# Patient Record
Sex: Female | Born: 1985 | Race: White | Hispanic: No | Marital: Married | Smoking: Current every day smoker
Health system: Southern US, Community
[De-identification: ages and names within clinical notes are randomized; demographics above are authoritative.]

## PROBLEM LIST (undated history)

## (undated) DIAGNOSIS — F329 Major depressive disorder, single episode, unspecified: Secondary | ICD-10-CM

## (undated) DIAGNOSIS — K219 Gastro-esophageal reflux disease without esophagitis: Secondary | ICD-10-CM

## (undated) DIAGNOSIS — J302 Other seasonal allergic rhinitis: Secondary | ICD-10-CM

## (undated) DIAGNOSIS — G43909 Migraine, unspecified, not intractable, without status migrainosus: Secondary | ICD-10-CM

## (undated) DIAGNOSIS — Z8489 Family history of other specified conditions: Secondary | ICD-10-CM

## (undated) DIAGNOSIS — F419 Anxiety disorder, unspecified: Secondary | ICD-10-CM

## (undated) DIAGNOSIS — Z87442 Personal history of urinary calculi: Secondary | ICD-10-CM

## (undated) DIAGNOSIS — G5601 Carpal tunnel syndrome, right upper limb: Secondary | ICD-10-CM

## (undated) DIAGNOSIS — F32A Depression, unspecified: Secondary | ICD-10-CM

## (undated) HISTORY — DX: Migraine, unspecified, not intractable, without status migrainosus: G43.909

## (undated) HISTORY — DX: Other seasonal allergic rhinitis: J30.2

## (undated) HISTORY — PX: MOUTH SURGERY: SHX715

## (undated) HISTORY — DX: Depression, unspecified: F32.A

## (undated) HISTORY — DX: Major depressive disorder, single episode, unspecified: F32.9

---

## 2012-06-01 ENCOUNTER — Encounter (HOSPITAL_COMMUNITY): Payer: Self-pay | Admitting: Emergency Medicine

## 2012-06-01 ENCOUNTER — Emergency Department (HOSPITAL_COMMUNITY)
Admission: EM | Admit: 2012-06-01 | Discharge: 2012-06-02 | Disposition: A | Discharge status: Home / Self Care | Payer: BC Managed Care – PPO | Attending: Emergency Medicine | Admitting: Emergency Medicine

## 2012-06-01 DIAGNOSIS — IMO0002 Reserved for concepts with insufficient information to code with codable children: Secondary | ICD-10-CM

## 2012-06-01 DIAGNOSIS — W278XXA Contact with other nonpowered hand tool, initial encounter: Secondary | ICD-10-CM | POA: Insufficient documentation

## 2012-06-01 DIAGNOSIS — Z23 Encounter for immunization: Secondary | ICD-10-CM | POA: Insufficient documentation

## 2012-06-01 DIAGNOSIS — Y929 Unspecified place or not applicable: Secondary | ICD-10-CM | POA: Insufficient documentation

## 2012-06-01 DIAGNOSIS — S61209A Unspecified open wound of unspecified finger without damage to nail, initial encounter: Secondary | ICD-10-CM | POA: Insufficient documentation

## 2012-06-01 DIAGNOSIS — Y939 Activity, unspecified: Secondary | ICD-10-CM | POA: Insufficient documentation

## 2012-06-01 NOTE — ED Notes (Signed)
PT. PRESENTS WITH LEFT PROXIMAL MIDDLE FINGER LACERATION SUSTAINED FROM A SCISSORS THIS EVENING , BLEEDING CONTROLLED , DRESSING APPLIED AT TRIAGE.

## 2012-06-02 MED ORDER — HYDROCODONE-ACETAMINOPHEN 5-325 MG PO TABS: 1.0000 | ORAL_TABLET | ORAL | Status: DC | PRN

## 2012-06-02 MED ORDER — TETANUS-DIPHTH-ACELL PERTUSSIS 5-2.5-18.5 LF-MCG/0.5 IM SUSP
0.5000 mL | Freq: Once | INTRAMUSCULAR | Status: AC
  Administered 2012-06-02: 0.5 mL via INTRAMUSCULAR
  Filled 2012-06-02: qty 0.5

## 2012-06-02 NOTE — ED Provider Notes (Signed)
Medical screening examination/treatment/procedure(s) were performed by non-physician practitioner and as supervising physician I was immediately available for consultation/collaboration.  Matrice Herro K Divonte Senger-Rasch, MD 06/02/12 0238 

## 2012-06-02 NOTE — ED Provider Notes (Signed)
History     CSN: [id number]  Arrival date & time 06/01/12  2114   First MD Initiated Contact with Patient 06/02/12 [phone]      Chief Complaint  Patient presents with  . Laceration    (Consider location/radiation/quality/duration/timing/severity/associated sxs/prior treatment) Patient is a 27 y.o. female presenting with skin laceration. The history is provided by the patient.  Laceration Location:  Finger Finger laceration location:  L ring finger Length (cm):  2 Depth:  Cutaneous Bleeding: controlled   Time since incident:  3 hours Injury mechanism: scisors  Pain details:    Quality:  Aching and throbbing   Severity:  Mild   Timing:  Constant Foreign body present:  No foreign bodies Relieved by:  Nothing Worsened by:  Nothing tried Ineffective treatments:  None tried Tetanus status:  Out of date   History reviewed. No pertinent past medical history.  History reviewed. No pertinent past surgical history.  No family history on file.  History  Substance Use Topics  . Smoking status: Not on file  . Smokeless tobacco: Not on file  . Alcohol Use: Not on file    OB History   Grav Para Term Preterm Abortions TAB SAB Ect Mult Living                  Review of Systems  Constitutional: Negative for fever, diaphoresis and activity change.  HENT: Negative for congestion and neck pain.   Respiratory: Negative for cough.   Genitourinary: Negative for dysuria.  Musculoskeletal: Negative for myalgias.  Skin: Positive for wound. Negative for color change.  Neurological: Negative for headaches.  All other systems reviewed and are negative.    Allergies  Review of patient's allergies indicates no known allergies.  Home Medications  No current outpatient prescriptions on file.  BP 138/88  Pulse 107  Temp(Src) 98.6 F (37 C) (Oral)  Resp 15  SpO2 98%  LMP 05/17/2012  Physical Exam  Nursing note and vitals reviewed. Constitutional: She is oriented to person,  place, and time. She appears well-developed and well-nourished. No distress.  HENT:  Head: Normocephalic and atraumatic.  Eyes: Conjunctivae and EOM are normal.  Neck: Normal range of motion.  Pulmonary/Chest: Effort normal.  Musculoskeletal: Normal range of motion.  Neurological: She is alert and oriented to person, place, and time.  Skin: Skin is warm and dry. Laceration noted. No rash noted. She is not diaphoretic.  Laceration over palmer surface of ring finger, left hand. Minimally bleeding.   Psychiatric: She has a normal mood and affect. Her behavior is normal.    ED Course  Procedures (including critical care time)  Labs Reviewed - No data to display No results found.  LACERATION REPAIR Performed by: Jaci Carrel Authorized by: Jaci Carrel Consent: Verbal consent obtained. Risks and benefits: risks, benefits and alternatives were discussed Consent given by: patient Patient identity confirmed: provided demographic data Prepped and Draped in normal sterile fashion Wound explored  Laceration Location: left hand palmer surface, ring finger  Laceration Length: 2cm  No Foreign Bodies seen or palpated  Anesthesia: local infiltration  Local anesthetic: lidocaine 2% NO epinephrine  Anesthetic total: 1 ml  Irrigation method: syringe Amount of cleaning: standard  Skin closure: 5.0 prolene  Number of sutures: 2  Technique: simple interupted  Patient tolerance: Patient tolerated the procedure well with no immediate complications.  No diagnosis found.    MDM  Laceration  Tdap booster given.Pressure irrigation performed. Laceration occurred < 8 hours prior to repair  which was well tolerated. Pt has no co morbidities to effect normal wound healing. Discussed suture home care w pt and answered questions. Pt to f-u for wound check and suture removal in 7 days. Pt is hemodynamically stable w no complaints prior to dc.           Jaci Carrel, [place] 06/02/12  [phone]

## 2012-06-15 ENCOUNTER — Ambulatory Visit (INDEPENDENT_AMBULATORY_CARE_PROVIDER_SITE_OTHER): Payer: BC Managed Care – PPO | Admitting: Family Medicine

## 2012-06-15 ENCOUNTER — Encounter: Payer: Self-pay | Admitting: Family Medicine

## 2012-06-15 VITALS — BP 118/80 | HR 98 | Temp 98.3°F | Ht 63.25 in | Wt 192.0 lb

## 2012-06-15 DIAGNOSIS — Z113 Encounter for screening for infections with a predominantly sexual mode of transmission: Secondary | ICD-10-CM

## 2012-06-15 DIAGNOSIS — Z7189 Other specified counseling: Secondary | ICD-10-CM

## 2012-06-15 DIAGNOSIS — Z7689 Persons encountering health services in other specified circumstances: Secondary | ICD-10-CM

## 2012-06-15 DIAGNOSIS — K5900 Constipation, unspecified: Secondary | ICD-10-CM

## 2012-06-15 DIAGNOSIS — Z136 Encounter for screening for cardiovascular disorders: Secondary | ICD-10-CM

## 2012-06-15 LAB — LIPID PANEL
HDL: 26.9 mg/dL — ABNORMAL LOW (ref >39.00)
Triglycerides: 133 mg/dL (ref 0.0–149.0)
VLDL: 26.6 mg/dL (ref 0.0–40.0)

## 2012-06-15 LAB — HEMOGLOBIN A1C: Hgb A1c MFr Bld: 5.6 % (ref 4.6–6.5)

## 2012-06-15 NOTE — Patient Instructions (Addendum)
-  We have ordered labs or studies at this visit. It can take up to 1-2 weeks for results and processing. We will contact you with instructions IF your results are abnormal. Normal results will be released to your [facility]. If you have not heard from [place] or can not find your results in [facility] in 2 weeks please contact our office.  -PLEASE SIGN UP FOR MYCHART TODAY   We recommend the following healthy lifestyle measures: - eat a healthy diet consisting of lots of vegetables, fruits, beans, nuts, seeds, healthy meats such as white chicken and fish and whole grains.  - avoid fried foods, fast food, processed foods, sodas, red meet and other fattening foods.  - get a least 150 minutes of aerobic exercise per week.   For the constipations: -use fiber supplement daily (metameucil or Citracel) -use mirilax for a few days when constipation to get things moving  Follow up in: 1-2 months for physical

## 2012-06-15 NOTE — Progress Notes (Signed)
Chief Complaint  Patient presents with  . Establish Care    HPI:  Angela Rivera is here to establish care. Recently moved to [place]. Last PCP and physical: has not had physical or pap in about 6 years - normal in the past  Has the following chronic problems and concerns today:  There is no problem list on file for this patient.  Health Maintenance:  Constipation/Hemorrhoids: -has had this chronically -large hard stools - sometimes BRB on stools with this -not taking fiber supplement  ROS: See pertinent positives and negatives per HPI.  Past Medical History  Diagnosis Date  . Blood in stool   . Chicken pox   . Seasonal allergies   . Kidney stones   . Migraines   . UTI (urinary tract infection)   . Heart murmur   . Depression     never hospitalized  . Constipation     Family History  Problem Relation Age of Onset  . Alcoholism Mother   . Alcoholism Father   . Alcohol abuse      grandparent  . Arthritis    . Ovarian cancer Other   . Colon cancer Other   . Breast cancer      grandparent  . Lung cancer Other   . Hyperlipidemia Mother   . Heart disease      grandparent  . Stroke Other   . Hypertension Mother   . Bipolar disorder Mother   . Bipolar disorder Sister   . Cirrhosis      aunt, uncle, and grandmother on father's side  . Hepatitis      aunt and grandmother  . Colon polyps Mother     History   Social History  . Marital Status: Single    Spouse Name: N/A    Number of Children: N/A  . Years of Education: N/A   Social History Main Topics  . Smoking status: Former Games developer  . Smokeless tobacco: None     Comment: per patient smokes when drinking   . Alcohol Use: Yes     Comment: rarely drinking - used to drink a lot, cutting back  . Drug Use: None  . Sexually Active: Yes     Comment: female partner   Other Topics Concern  . 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  EXAMCeasar Mons Vitals:   06/15/12 0814  BP: 118/80  Pulse: 98  Temp: 98.3 F (36.8 C)    Body mass index is 33.72 kg/(m^2).  GENERAL: vitals reviewed and listed above, alert, oriented, appears well hydrated and in no acute distress  HEENT: atraumatic, conjunttiva clear, no obvious abnormalities on inspection of external nose and ears  NECK: no obvious masses on inspection  LUNGS: clear to auscultation bilaterally, no wheezes, rales or rhonchi, good air movement  CV: HRRR, no peripheral edema  MS: moves all extremities without noticeable abnormality  PSYCH: pleasant and cooperative, no obvious depression or anxiety  ASSESSMENT AND PLAN:  Discussed the following assessment and plan:  Encounter to establish care with new doctor - Plan: Hemoglobin A1c, Lipid Panel  Routine screening for STI (sexually transmitted infection) - Plan: HIV Antibody, RPR -We reviewed  the PMH, PSH, FH, SH, Meds and Allergies. -discussed healthy diet and exercise -advised daily fiber supplement -FASTING LABS today -patient to schedule follow up  -Patient advised to return or notify a doctor immediately if symptoms worsen or persist or new concerns arise.  Patient Instructions  -We have ordered labs or studies at this visit. It can take up to 1-2 weeks for results and processing. We will contact you with instructions IF your results are abnormal. Normal results will be released to your [facility]. If you have not heard from [place] or can not find your results in [facility] in 2 weeks please contact our office.  -PLEASE SIGN UP FOR MYCHART TODAY   We recommend the following healthy lifestyle measures: - eat a healthy diet consisting of lots of vegetables, fruits, beans, nuts, seeds, healthy meats such as white chicken and fish and whole  grains.  - avoid fried foods, fast food, processed foods, sodas, red meet and other fattening foods.  - get a least 150 minutes of aerobic exercise per week.   For the constipations: -use fiber supplement daily (metameucil or Citracel) -use mirilax for a few days when constipation to get things moving  Follow up in: 1-2 months for physical      KIM, HANNAH R.

## 2012-06-16 ENCOUNTER — Telehealth: Payer: Self-pay | Admitting: Family Medicine

## 2012-06-16 LAB — HIV ANTIBODY (ROUTINE TESTING W REFLEX): HIV: NONREACTIVE

## 2012-06-16 NOTE — Telephone Encounter (Signed)
Most of her labs look good. Cholesterol is mildly abnormal. A healthy diet and regular exercise is advised.

## 2012-06-18 NOTE — Telephone Encounter (Signed)
Called and spoke with pt and pt is aware.  

## 2012-07-18 ENCOUNTER — Ambulatory Visit (INDEPENDENT_AMBULATORY_CARE_PROVIDER_SITE_OTHER): Payer: BC Managed Care – PPO | Admitting: Family Medicine

## 2012-07-18 ENCOUNTER — Encounter: Payer: Self-pay | Admitting: Family Medicine

## 2012-07-18 VITALS — BP 112/86 | HR 117 | Temp 98.6°F | Wt 194.0 lb

## 2012-07-18 DIAGNOSIS — Z124 Encounter for screening for malignant neoplasm of cervix: Secondary | ICD-10-CM

## 2012-07-18 DIAGNOSIS — F329 Major depressive disorder, single episode, unspecified: Secondary | ICD-10-CM

## 2012-07-18 DIAGNOSIS — Z7689 Persons encountering health services in other specified circumstances: Secondary | ICD-10-CM

## 2012-07-18 DIAGNOSIS — F3289 Other specified depressive episodes: Secondary | ICD-10-CM

## 2012-07-18 DIAGNOSIS — Z0000 Encounter for general adult medical examination without abnormal findings: Secondary | ICD-10-CM

## 2012-07-18 NOTE — Progress Notes (Signed)
Chief Complaint  Patient presents with  . Annual Exam    HPI:  Here for CPE:  -Concerns today: none, other then small bump on labia - doesn't not bother her - thinks skin tag -occ depressed mood and irritability near periods, no SI or HI, mild syptoms  -Diet:diet is still poor  -Taking folic acid: no  -Exercise: no regular exercise  -Diabetes and Dyslipidemia Screening: done recently, mild HLD - lifestyle recs  -Hx of HTN: no  -Vaccines: UTD  -pap history: thinks last pap was about 8 years ago and was normal  -FDLMP: March 25, monthly, regular  -sexual activity: yes, one female partner, no new partners  -wants STI testing: done at recent appt - HIV and RPR - does not want testing for GC/Chlam  -FH breast, colon or ovarian ca: see FH  -Alcohol, Tobacco, drug use: see social history  Review of Systems - Denies vaginal dishcharge, urinary symptoms, SOB, CP, changes bowels other then improved constipation with fiber   Past Medical History  Diagnosis Date  . Blood in stool   . Chicken pox   . Seasonal allergies   . Kidney stones   . Migraines   . UTI (urinary tract infection)   . Heart murmur   . Depression     never hospitalized  . Constipation     Family History  Problem Relation Age of Onset  . Alcoholism Mother   . Alcoholism Father   . Alcohol abuse      grandparent  . Arthritis    . Ovarian cancer Other   . Colon cancer Other   . Breast cancer      grandparent  . Lung cancer Other   . Hyperlipidemia Mother   . Heart disease      grandparent  . Stroke Other   . Hypertension Mother   . Bipolar disorder Mother   . Bipolar disorder Sister   . Cirrhosis      aunt, uncle, and grandmother on father's side  . Hepatitis      aunt and grandmother  . Colon polyps Mother     History   Social History  . Marital Status: Single    Spouse Name: N/A    Number of Children: N/A  . Years of Education: N/A   Social History Main Topics  . Smoking  status: Former Games developer  . Smokeless tobacco: None     Comment: per patient smokes when drinking   . Alcohol Use: Yes     Comment: rarely drinking - used to drink a lot, cutting back  . Drug Use: None  . Sexually Active: Yes     Comment: female partner   Other Topics Concern  . 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  EXAM:  Filed Vitals:   07/18/12 1429  BP: 112/86  Pulse: 117  Temp: 98.6 F (37 C)    GENERAL: vitals reviewed and listed below, alert, oriented, appears well hydrated and in no acute distress  HEENT: head atraumatic, PERRLA, normal appearance of eyes, ears, nose and mouth. moist mucus membranes.  NECK: supple, no masses or lymphadenopathy  LUNGS: clear to auscultation bilaterally, no rales, rhonchi  or wheeze  CV: HRRR, no peripheral edema or cyanosis, normal pedal pulses  BREAST: normal appearance - no lesions or discharge, on palpation normal breast tissue without any suspicious masses  ABDOMEN: bowel sounds normal, soft, non tender to palpation, no masses, no rebound or guarding  GU: normal appearance of external genitalia - no lesions or masses, normal vaginal mucosa - no abnormal discharge, normal appearance of cervix - no lesions or abnormal discharge, no masses or tenderness on palpation of uterus and ovaries.  RECTAL: refused  SKIN: no rash or abnormal lesions  MS: normal gait, moves all extremities normally  NEURO: CN II-XII grossly intact, normal muscle strength and sensation to light touch on extremities  PSYCH: normal affect, pleasant and cooperative  ASSESSMENT AND PLAN:  Discussed the following assessment and plan:  Encounter to establish care - Plan: Cytology - PAP  Depression  Pap smear for cervical  cancer screening - Plan: Cytology - PAP  -27 yo Female here for health maintenance exam  -Discussed and advised all [place] preventive services health task force level A and B recommendations for age, sex and risks.  -mild depression: advised regular exercise, healthy diet, counseling, follow up  -Advised at least 150 minutes of exercise per week and a healthy diet low in saturated fats and sweets and consisting of fresh fruits and vegetables, lean meats such as fish and white chicken and whole grains.  -labs, studies and vaccines per orders this encounter  No orders of the defined types were placed in this encounter.    Patient Instructions  -We have ordered a pap smear at this visit. It can take up to 1-2 weeks for results and processing. We will contact you with instructions IF your results are abnormal. Normal results will be released to your [facility]. If you have not heard from [place] or can not find your results in [facility] in 2 weeks please contact our office.  We recommend the following healthy lifestyle measures: - eat a healthy diet consisting of lots of vegetables, fruits, beans, nuts, seeds, healthy meats such as white chicken and fish and whole grains.  - avoid fried foods, fast food, processed foods, sodas, red meet and other fattening foods.  - get a least 150 minutes of aerobic exercise per week.   For mild depression get regular exercise and schedule counseling  Follow up in 2 months           Patient advised to return to clinic immediately if symptoms worsen or persist or new concerns.  @LIFEPLAN @  Return in about 2 months (around 09/17/2012) for depression.  Kriste Basque R.

## 2012-07-18 NOTE — Patient Instructions (Signed)
-  We have ordered a pap smear at this visit. It can take up to 1-2 weeks for results and processing. We will contact you with instructions IF your results are abnormal. Normal results will be released to your [facility]. If you have not heard from [place] or can not find your results in [facility] in 2 weeks please contact our office.  We recommend the following healthy lifestyle measures: - eat a healthy diet consisting of lots of vegetables, fruits, beans, nuts, seeds, healthy meats such as white chicken and fish and whole grains.  - avoid fried foods, fast food, processed foods, sodas, red meet and other fattening foods.  - get a least 150 minutes of aerobic exercise per week.   For mild depression get regular exercise and schedule counseling  Follow up in 2 months

## 2012-07-22 ENCOUNTER — Telehealth: Payer: Self-pay | Admitting: Family Medicine

## 2012-07-22 NOTE — Telephone Encounter (Signed)
Called and left a detailed message for pt at designated cell phone number.  

## 2012-07-22 NOTE — Telephone Encounter (Signed)
Pap looks good.Need another pap in 3 years.

## 2012-09-21 ENCOUNTER — Ambulatory Visit: Payer: BC Managed Care – PPO | Admitting: Family Medicine

## 2012-09-28 ENCOUNTER — Ambulatory Visit: Payer: BC Managed Care – PPO | Admitting: Family Medicine

## 2012-10-13 ENCOUNTER — Emergency Department (HOSPITAL_COMMUNITY)
Admission: EM | Admit: 2012-10-13 | Discharge: 2012-10-13 | Disposition: A | Discharge status: Home / Self Care | Payer: BC Managed Care – PPO | Source: Home / Self Care | Attending: Emergency Medicine | Admitting: Emergency Medicine

## 2012-10-13 ENCOUNTER — Encounter (HOSPITAL_COMMUNITY): Payer: Self-pay | Admitting: *Deleted

## 2012-10-13 DIAGNOSIS — R42 Dizziness and giddiness: Secondary | ICD-10-CM

## 2012-10-13 LAB — POCT I-STAT, CHEM 8
BUN: 13 mg/dL (ref 6–23)
Calcium, Ion: 1.25 mmol/L — ABNORMAL HIGH (ref 1.12–1.23)
Chloride: 104 mEq/L (ref 96–112)
Creatinine, Ser: 0.8 mg/dL (ref 0.50–1.10)
Glucose, Bld: 86 mg/dL (ref 70–99)
HCT: 40 % (ref 36.0–46.0)

## 2012-10-13 NOTE — ED Provider Notes (Signed)
Chief Complaint:   Chief Complaint  Patient presents with  . Dizziness    History of Present Illness:    Angela Rivera is a 27 year old female who stood up today around noon when she suddenly felt dizzy. She felt like she was drunk. She denies any whirling vertigo or presyncope. The episode lasted about 2-3 minutes. This was followed by headache and pressure in her head which was mostly frontal, fatigue, exhaustion, and slight nausea. She had a pinching pain in her left pectoral area. She denies any radiation of this discomfort, nausea, diaphoresis, or shortness of breath. She has had no diplopia or blurry vision. She denies any URI symptoms. She's had no palpitations. She denies any abdominal pain, vomiting, or diarrhea. She has had no neurological symptoms. 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 She's been perspiring freely. She's been trying to rehydrate with sweetened tea.  Review of Systems:  Other than noted above, the patient denies any of the following symptoms: Systemic:  No fever, chills, fatigue, or weight loss. Eye:  No blurred vision, visual change or diplopia. ENT:  No ear pain, tinnitus, hearing loss, nasal congestion, or rhinorrhea. Cardiac:  No chest pain, dyspnea, palpitations or syncope. Neuro:  No headache, paresthesias, weakness, trouble with speech, coordination or ambulation.  PMFSH:  Past medical history, family history, social history, meds, and allergies were reviewed.   Physical Exam:   Vital signs:  BP 120/75  Pulse 90  Temp(Src) 97.9 F (36.6 C)  SpO2 100%  LMP 09/24/2012 Filed Vitals:   10/13/12 1408 10/13/12 1420 Sitting  10/13/12 1540 Supine  10/13/12 1541 Standing   BP:  123/75 121/64 120/75  Pulse:  105 71 90  Temp: 97.9 F (36.6 C)     SpO2: 100%       General:  Alert, oriented times 3, in no distress. Eye:  PERRL, full EOM, no nystagmus. ENT:  TMs and canals normal.   Nasal mucosa normal.  Pharynx clear. Neck:  No adenopathy, tenderness, or mass.  Thyroid normal.  No carotid bruit. Lungs:  Breath sounds clear and equal bilaterally.  No wheezes, rales or rhonchi. Heart:  Regular rhythm.  No gallops, murmers, or rubs. Neuro:  Alert and oriented times 3.  Cranial nerves intact.  No pronator drift.  Finger to nose normal.  No focal weakness.  Sensation intact to light touch.  Romberg's sign negative, gait normal.  Able to do tandem gait well.   Labs:   Results for orders placed during the hospital encounter of 10/13/12  POCT I-STAT, CHEM 8      Result Value Range   Sodium 139  135 - 145 mEq/L   Potassium 4.3  3.5 - 5.1 mEq/L   Chloride 104  96 - 112 mEq/L   BUN 13  6 - 23 mg/dL   Creatinine, Ser 1.61  0.50 - 1.10 mg/dL   Glucose, Bld 86  70 - 99 mg/dL   Calcium, Ion 0.96 (*) 1.12 - 1.23 mmol/L   TCO2 25  0 - 100 mmol/L   Hemoglobin 13.6  12.0 - 15.0 g/dL   HCT 04.5  40.9 - 81.1 %     EKG Results:  Date: 10/13/2012  Rate: 79  Rhythm: normal sinus rhythm and sinus arrhythmia  QRS Axis: normal  Intervals: normal  ST/T Wave abnormalities: normal  Conduction Disutrbances:none  Narrative Interpretation: Normal sinus rhythm with sinus  arrhythmia, normal EKG.  Old EKG Reviewed: none available  Assessment:  The encounter diagnosis was Dizziness.  Dizziness of uncertain cause. 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 I urged her to rest today and a cool area, get extra fluids, and to use water or Gatorade for rehydration in the future. It should be noted that she feels fine right now and is not having any symptoms.  Plan:   1.  The following meds were prescribed:  There are no discharge medications for this patient.  2.  The patient was instructed in symptomatic care and handouts were given. 3.   The patient was told to return if becoming worse in any way, if no better in 3 or 4 days, and given some red flag symptoms such as recurrences of vertigo, difficulty breathing, worsening chest pain, syncope, palpitations, or shortness of breath that would indicate earlier return. 4.  Follow up here if needed.    Reuben Likes, MD 10/13/12 [phone]

## 2012-10-13 NOTE — ED Notes (Signed)
Called  Out  To  asses  Pt   She  Has  Been  Getting  In and  Out  Of    Cars  Today  dele[employer]  [employer]  [employer]  It is  Very       Hot  Outside  And  She  Has been  Drinking tea      She  Reports  She  Gets  Dizzy  As  Well        bp  110 /70  Pulse  Is  104  resps 20  She  Was  Given  Some  Water  -  She  Reports  Vague  l  Upper  Chest pain at times  As  Well   She  denys  Any  History

## 2012-11-11 ENCOUNTER — Encounter: Payer: Self-pay | Admitting: Family Medicine

## 2012-11-11 ENCOUNTER — Ambulatory Visit (INDEPENDENT_AMBULATORY_CARE_PROVIDER_SITE_OTHER): Payer: BC Managed Care – PPO | Admitting: Family Medicine

## 2012-11-11 VITALS — BP 120/80 | Temp 98.5°F | Wt 194.0 lb

## 2012-11-11 DIAGNOSIS — N926 Irregular menstruation, unspecified: Secondary | ICD-10-CM

## 2012-11-11 NOTE — Progress Notes (Signed)
Chief Complaint  Patient presents with  . Menstrual Problem    HPI:  Acute visit for irregular menses: -FDLMP: July 26th 2014; prior period was July 4th -periods had been monthly for a long time and regular -she has some cramping a few days before periods -no spotting in between or since -flow is normal, periods typical 3-7 days -denies fevers, chills, urinary symptoms, vaginal symptoms, abd pain now, spotting  ROS: See pertinent positives and negatives per HPI.  Past Medical History  Diagnosis Date  . Blood in stool   . Chicken pox   . Seasonal allergies   . Kidney stones   . Migraines   . UTI (urinary tract infection)   . Heart murmur   . Depression     never hospitalized  . Constipation     Family History  Problem Relation Age of Onset  . Alcoholism Mother   . Alcoholism Father   . Alcohol abuse      grandparent  . Arthritis    . Ovarian cancer Other   . Colon cancer Other   . Breast cancer      grandparent  . Lung cancer Other   . Hyperlipidemia Mother   . Heart disease      grandparent  . Stroke Other   . Hypertension Mother   . Bipolar disorder Mother   . Bipolar disorder Sister   . Cirrhosis      aunt, uncle, and grandmother on father's side  . Hepatitis      aunt and grandmother  . Colon polyps Mother     History   Social History  . Marital Status: Single    Spouse Name: N/A    Number of Children: N/A  . Years of Education: N/A   Social History Main Topics  . Smoking status: Former Games developer  . Smokeless tobacco: None     Comment: per patient smokes when drinking   . Alcohol Use: Yes     Comment: rarely drinking - used to drink a lot, cutting back  . Drug Use: None  . Sexually Active: Yes     Comment: female partner   Other Topics Concern  . 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  EXAM:  Filed Vitals:   11/11/12 1454  BP: 120/80  Temp: 98.5 F (36.9 C)    Body mass index is 34.08 kg/(m^2).  GENERAL: vitals reviewed and listed above, alert, oriented, appears well hydrated and in no acute distress  HEENT: atraumatic, conjunttiva clear, no obvious abnormalities on inspection of external nose and ears  NECK: no obvious masses on inspection  LUNGS: clear to auscultation bilaterally, no wheezes, rales or rhonchi, good air movement  CV: HRRR, no peripheral edema  ABD: BS+, soft, NTTP  MS: moves all extremities without noticeable abnormality  PSYCH: pleasant and cooperative, no obvious depression or anxiety  ASSESSMENT AND PLAN:  Discussed the following assessment and plan:  Irregular menses  -one period came 5 days early - no other concerning symptoms and normal abd exam -her partner's, whom she lives with has been having irr periods -advised, this is not too alarming and advised to keep track of periods  for next few months nad follow up if continued irregularity or other concerns -advised ibuprofen for her occ mild menstrual cramping -Patient advised to return or notify a doctor immediately if symptoms worsen or persist or new concerns arise.  There are no Patient Instructions on file for this visit.   Kriste Basque R.

## 2012-12-07 ENCOUNTER — Emergency Department (HOSPITAL_COMMUNITY)
Admission: EM | Admit: 2012-12-07 | Discharge: 2012-12-07 | Disposition: A | Discharge status: Home / Self Care | Payer: BC Managed Care – PPO | Source: Home / Self Care

## 2012-12-07 ENCOUNTER — Encounter (HOSPITAL_COMMUNITY): Payer: Self-pay | Admitting: *Deleted

## 2012-12-07 DIAGNOSIS — J302 Other seasonal allergic rhinitis: Secondary | ICD-10-CM

## 2012-12-07 DIAGNOSIS — R0982 Postnasal drip: Secondary | ICD-10-CM

## 2012-12-07 DIAGNOSIS — J309 Allergic rhinitis, unspecified: Secondary | ICD-10-CM

## 2012-12-07 MED ORDER — FLUTICASONE PROPIONATE 50 MCG/ACT NA SUSP: 2.0000 | Freq: Every day | NASAL | Status: DC

## 2012-12-07 MED ORDER — PREDNISONE 20 MG PO TABS: 20.0000 mg | ORAL_TABLET | Freq: Every day | ORAL | Status: DC

## 2012-12-07 NOTE — ED Provider Notes (Signed)
Valeska Haislip is a 27 y.o. female who presents to Urgent Care today for bilateral ear pain associated with coughing sneezing itchy watery eyes. Patient denies any anterior nasal discharge. She's tried some ibuprofen which has helped a bit. The pain is mild and not worsened with activity or improved with rest. She denies any fevers or chills, nausea vomiting or diarrhea. She has a history of seasonal allergies. She has a sick contact with her partner at home. Healthy otherwise.    PMH reviewed. Seasonal allergies History  Substance Use Topics  . Smoking status: Current Every Day Smoker  . Smokeless tobacco: Not on file     Comment: per patient smokes when drinking   . Alcohol Use: Yes     Comment: rarely drinking - used to drink a lot, cutting back   ROS as above Medications reviewed. No current facility-administered medications for this encounter.   Current Outpatient Prescriptions  Medication Sig Dispense Refill  . fluticasone (FLONASE) 50 MCG/ACT nasal spray Place 2 sprays into the nose daily.  16 g  2  . predniSONE (DELTASONE) 20 MG tablet Take 1 tablet (20 mg total) by mouth daily.  7 tablet  0    Exam:  BP 130/83  Pulse 100  Temp(Src) 98.3 F (36.8 C) (Oral)  Resp 18  SpO2 100%  LMP 11/05/2012 Gen: Well NAD HEENT: EOMI,  MMM normal appearing tympanic membranes bilaterally. Posterior pharynx is erythematous with cobblestoning. No significant lymphadenopathy in the cervical chain Lungs: CTABL Nl WOB Heart: RRR no MRG Abd: NABS, NT, ND Exts: Non edematous BL  LE, warm and well perfused.   No results found for this or any previous visit (from the past 24 hour(s)). No results found.  Assessment and Plan: 27 y.o. female with seasonal allergies with postnasal drip and otalgia.  Plan to treat with Flonase nasal spray, Zyrtec, and one week of low dose prednisone.  Followup if not improving Discussed warning signs or symptoms. Please see discharge instructions. Patient  expresses understanding.      Rodolph Bong, MD 12/07/12 1056

## 2012-12-07 NOTE — ED Notes (Signed)
Pt  Reports  Symptoms  Of  Bilateral  Earache /  Pressure  For  About  1   Week  Started  Off  r  Ear   Now  In  Both      Pt   Reports  Symptoms  Of a  Headache  As  Well     Sitting  Upright  On  Exam table  Speaking in  Complete  sentances  And  Is in no acute  distress

## 2012-12-13 ENCOUNTER — Encounter (HOSPITAL_COMMUNITY): Payer: Self-pay | Admitting: Emergency Medicine

## 2012-12-13 ENCOUNTER — Emergency Department (INDEPENDENT_AMBULATORY_CARE_PROVIDER_SITE_OTHER)
Admission: EM | Admit: 2012-12-13 | Discharge: 2012-12-13 | Disposition: A | Discharge status: Home / Self Care | Payer: BC Managed Care – PPO | Source: Home / Self Care

## 2012-12-13 DIAGNOSIS — J019 Acute sinusitis, unspecified: Secondary | ICD-10-CM

## 2012-12-13 MED ORDER — AMOXICILLIN-POT CLAVULANATE 875-125 MG PO TABS: 1.0000 | ORAL_TABLET | Freq: Two times a day (BID) | ORAL | Status: DC

## 2012-12-13 NOTE — ED Provider Notes (Signed)
CSN: [id number]     Arrival date & time 12/13/12  1114 History   None    Chief Complaint  Patient presents with  . Ear Fullness   (Consider location/radiation/quality/duration/timing/severity/associated sxs/prior Treatment) Patient is a 27 y.o. female presenting with plugged ear sensation. The history is provided by the patient.  Ear Fullness This is a new problem. The current episode started more than 2 days ago. The problem has not changed (seen at [facility] last week and sx continue.) since onset.   Past Medical History  Diagnosis Date  . Blood in stool   . Chicken pox   . Seasonal allergies   . Kidney stones   . Migraines   . UTI (urinary tract infection)   . Heart murmur   . Depression     never hospitalized  . Constipation    Past Surgical History  Procedure Laterality Date  . Mouth surgery      elementary school    Family History  Problem Relation Age of Onset  . Alcoholism Mother   . Alcoholism Father   . Alcohol abuse      grandparent  . Arthritis    . Ovarian cancer Other   . Colon cancer Other   . Breast cancer      grandparent  . Lung cancer Other   . Hyperlipidemia Mother   . Heart disease      grandparent  . Stroke Other   . Hypertension Mother   . Bipolar disorder Mother   . Bipolar disorder Sister   . Cirrhosis      aunt, uncle, and grandmother on father's side  . Hepatitis      aunt and grandmother  . Colon polyps Mother    History  Substance Use Topics  . Smoking status: Current Every Day Smoker  . Smokeless tobacco: Not on file     Comment: per patient smokes when drinking   . Alcohol Use: Yes     Comment: rarely drinking - used to drink a lot, cutting back   OB History   Grav Para Term Preterm Abortions TAB SAB Ect Mult Living                 Review of Systems  Constitutional: Negative.   HENT: Positive for ear pain, congestion, postnasal drip and sinus pressure. Negative for sore throat and ear discharge.   Respiratory: Negative.    Cardiovascular: Negative.     Allergies  Review of patient's allergies indicates no known allergies.  Home Medications   Current Outpatient Rx  Name  Route  Sig  Dispense  Refill  . amoxicillin-clavulanate (AUGMENTIN) 875-125 MG per tablet   Oral   Take 1 tablet by mouth 2 (two) times daily.   20 tablet   0   . fluticasone (FLONASE) 50 MCG/ACT nasal spray   Nasal   Place 2 sprays into the nose daily.   16 g   2   . predniSONE (DELTASONE) 20 MG tablet   Oral   Take 1 tablet (20 mg total) by mouth daily.   7 tablet   0    BP 128/83  Pulse 90  Temp(Src) 98.5 F (36.9 C) (Oral)  Resp 16  SpO2 98%  LMP 11/30/2012 Physical Exam  Nursing note and vitals reviewed. Constitutional: She is oriented to person, place, and time. She appears well-developed and well-nourished.  HENT:  Head: Normocephalic.  Right Ear: External ear normal.  Left Ear: External ear normal.  Mouth/Throat:  Oropharynx is clear and moist.  Eyes: Conjunctivae are normal. Pupils are equal, round, and reactive to light.  Neck: Normal range of motion. Neck supple.  Pulmonary/Chest: Effort normal and breath sounds normal.  Lymphadenopathy:    She has no cervical adenopathy.  Neurological: She is alert and oriented to person, place, and time.  Skin: Skin is warm and dry.    ED Course  Procedures (including critical care time) Labs Review Labs Reviewed - No data to display Imaging Review No results found.  MDM   1. Sinusitis, acute        Linna Hoff, MD 12/13/12 1215

## 2012-12-13 NOTE — ED Notes (Signed)
Patient seen recently for the same, reports she is still taking prednisone, tomorrow will be the last day. Patient reports neck was improving , but as of yesterday started getting worse again.  Patient has fullness in ears, particularly the right ear.  Patient has fullness below right ear and right neck.  No sore throat.

## 2013-07-06 DIAGNOSIS — Z8619 Personal history of other infectious and parasitic diseases: Secondary | ICD-10-CM | POA: Insufficient documentation

## 2013-07-06 DIAGNOSIS — Z87442 Personal history of urinary calculi: Secondary | ICD-10-CM | POA: Insufficient documentation

## 2013-07-06 DIAGNOSIS — Z792 Long term (current) use of antibiotics: Secondary | ICD-10-CM | POA: Insufficient documentation

## 2013-07-06 DIAGNOSIS — Z79899 Other long term (current) drug therapy: Secondary | ICD-10-CM | POA: Insufficient documentation

## 2013-07-06 DIAGNOSIS — F329 Major depressive disorder, single episode, unspecified: Secondary | ICD-10-CM | POA: Insufficient documentation

## 2013-07-06 DIAGNOSIS — J069 Acute upper respiratory infection, unspecified: Secondary | ICD-10-CM | POA: Insufficient documentation

## 2013-07-06 DIAGNOSIS — Z8679 Personal history of other diseases of the circulatory system: Secondary | ICD-10-CM | POA: Insufficient documentation

## 2013-07-06 DIAGNOSIS — IMO0002 Reserved for concepts with insufficient information to code with codable children: Secondary | ICD-10-CM | POA: Insufficient documentation

## 2013-07-06 DIAGNOSIS — F3289 Other specified depressive episodes: Secondary | ICD-10-CM | POA: Insufficient documentation

## 2013-07-06 DIAGNOSIS — F17200 Nicotine dependence, unspecified, uncomplicated: Secondary | ICD-10-CM | POA: Insufficient documentation

## 2013-07-06 DIAGNOSIS — H6690 Otitis media, unspecified, unspecified ear: Secondary | ICD-10-CM | POA: Insufficient documentation

## 2013-07-06 DIAGNOSIS — Z8719 Personal history of other diseases of the digestive system: Secondary | ICD-10-CM | POA: Insufficient documentation

## 2013-07-06 DIAGNOSIS — R011 Cardiac murmur, unspecified: Secondary | ICD-10-CM | POA: Insufficient documentation

## 2013-07-06 NOTE — ED Notes (Signed)
Pt. Reports she has had nasal congestion and now reports sore throat with cough.

## 2013-07-07 ENCOUNTER — Emergency Department (HOSPITAL_BASED_OUTPATIENT_CLINIC_OR_DEPARTMENT_OTHER)
Admission: EM | Admit: 2013-07-07 | Discharge: 2013-07-07 | Disposition: A | Discharge status: Home / Self Care | Payer: 59 | Attending: Emergency Medicine | Admitting: Emergency Medicine

## 2013-07-07 DIAGNOSIS — H6691 Otitis media, unspecified, right ear: Secondary | ICD-10-CM

## 2013-07-07 DIAGNOSIS — J069 Acute upper respiratory infection, unspecified: Secondary | ICD-10-CM

## 2013-07-07 MED ORDER — HYDROCOD POLST-CHLORPHEN POLST 10-8 MG/5ML PO LQCR: 5.0000 mL | Freq: Two times a day (BID) | ORAL | Status: DC | PRN

## 2013-07-07 MED ORDER — AMOXICILLIN 500 MG PO CAPS: 1000.0000 mg | ORAL_CAPSULE | Freq: Three times a day (TID) | ORAL | Status: DC

## 2013-07-07 MED ORDER — AMOXICILLIN 500 MG PO CAPS
1000.0000 mg | ORAL_CAPSULE | Freq: Once | ORAL | Status: AC
  Administered 2013-07-07: 1000 mg via ORAL
  Filled 2013-07-07: qty 2

## 2013-07-07 NOTE — ED Provider Notes (Signed)
CSN: [id number]     Arrival date & time 07/06/13  2331 History   First MD Initiated Contact with Patient 07/07/13 0220     Chief Complaint  Patient presents with  . URI     (Consider location/radiation/quality/duration/timing/severity/associated sxs/prior Treatment) HPI Is a 28 year old female with a three-day history of upper respiratory infection symptoms. Specifically she's had nasal congestion, sore throat and over the past 24 hours has developed a right earache. She has been coughing, notably at night. She denies fever, chills, vomiting or diarrhea. She has taken over-the-counter nasal decongestant without adequate relief. She describes her right earache and is being her most troubling symptom at the present time.  Past Medical History  Diagnosis Date  . Blood in stool   . Chicken pox   . Seasonal allergies   . Kidney stones   . Migraines   . UTI (urinary tract infection)   . Heart murmur   . Depression     never hospitalized  . Constipation    Past Surgical History  Procedure Laterality Date  . Mouth surgery      elementary school    Family History  Problem Relation Age of Onset  . Alcoholism Mother   . Alcoholism Father   . Alcohol abuse      grandparent  . Arthritis    . Ovarian cancer Other   . Colon cancer Other   . Breast cancer      grandparent  . Lung cancer Other   . Hyperlipidemia Mother   . Heart disease      grandparent  . Stroke Other   . Hypertension Mother   . Bipolar disorder Mother   . Bipolar disorder Sister   . Cirrhosis      aunt, uncle, and grandmother on father's side  . Hepatitis      aunt and grandmother  . Colon polyps Mother    History  Substance Use Topics  . Smoking status: Current Every Day Smoker  . Smokeless tobacco: Not on file     Comment: per patient smokes when drinking   . Alcohol Use: Yes     Comment: rarely drinking - used to drink a lot, cutting back   OB History   Grav Para Term Preterm Abortions TAB SAB Ect  Mult Living                 Review of Systems  All other systems reviewed and are negative.      Allergies  Review of patient's allergies indicates no known allergies.  Home Medications   Current Outpatient Rx  Name  Route  Sig  Dispense  Refill  . loratadine (CLARITIN) 10 MG tablet   Oral   Take 10 mg by mouth daily.         . sertraline (ZOLOFT) 100 MG tablet   Oral   Take 100 mg by mouth daily.         Marland Kitchen amoxicillin-clavulanate (AUGMENTIN) 875-125 MG per tablet   Oral   Take 1 tablet by mouth 2 (two) times daily.   20 tablet   0   . fluticasone (FLONASE) 50 MCG/ACT nasal spray   Nasal   Place 2 sprays into the nose daily.   16 g   2   . predniSONE (DELTASONE) 20 MG tablet   Oral   Take 1 tablet (20 mg total) by mouth daily.   7 tablet   0    BP 130/82  Pulse 110  Temp(Src) 98.6 F (37 C) (Oral)  Resp 17  Ht 5' 3.25" (1.607 m)  Wt 187 lb (84.823 kg)  BMI 32.85 kg/m2  SpO2 98%  LMP 06/22/2013  Physical Exam General: Well-developed, well-nourished female in no acute distress; appearance consistent with age of record HENT: normocephalic; atraumatic; left TM normal, right TM erythematous; no pharyngeal erythema or exudate; nasal congestion Eyes: pupils equal, round and reactive to light; extraocular muscles intact Neck: supple Heart: regular rate and rhythm Lungs: clear to auscultation bilaterally Abdomen: soft; nondistended; nontender; no masses or hepatosplenomegaly; bowel sounds present Extremities: No deformity; full range of motion Neurologic: Awake, alert and oriented; motor function intact in all extremities and symmetric; no facial droop Skin: Warm and dry Psychiatric: Normal mood and affect    ED Course  Procedures (including critical care time)  MDM      Hanley SeamenJohn L Darrin Koman, MD 07/07/13 [id number]

## 2014-07-30 ENCOUNTER — Encounter (HOSPITAL_BASED_OUTPATIENT_CLINIC_OR_DEPARTMENT_OTHER): Payer: Self-pay | Admitting: *Deleted

## 2014-07-31 ENCOUNTER — Encounter (HOSPITAL_BASED_OUTPATIENT_CLINIC_OR_DEPARTMENT_OTHER): Payer: Self-pay | Admitting: *Deleted

## 2014-07-31 NOTE — Progress Notes (Signed)
NPO AFTER MN WITH EXCEPTION CLEAR LIQUIDS UNTIL 0700 (NO CREAM/ MILK PRODUCTS).  ARRIVE AT 1130. NEEDS HG AND URINE PREG. WILL TAKE ZOLOFT AND ZYRTEC AM DOS W/ SIPS OF WATER.

## 2014-08-02 ENCOUNTER — Ambulatory Visit (HOSPITAL_BASED_OUTPATIENT_CLINIC_OR_DEPARTMENT_OTHER): Payer: Worker's Compensation | Admitting: Anesthesiology

## 2014-08-02 ENCOUNTER — Ambulatory Visit (HOSPITAL_BASED_OUTPATIENT_CLINIC_OR_DEPARTMENT_OTHER)
Admission: RE | Admit: 2014-08-02 | Discharge: 2014-08-02 | Disposition: A | Discharge status: Home / Self Care | Payer: Worker's Compensation | Source: Ambulatory Visit | Attending: Orthopedic Surgery | Admitting: Orthopedic Surgery

## 2014-08-02 ENCOUNTER — Encounter (HOSPITAL_BASED_OUTPATIENT_CLINIC_OR_DEPARTMENT_OTHER): Payer: Self-pay | Admitting: Anesthesiology

## 2014-08-02 ENCOUNTER — Encounter (HOSPITAL_BASED_OUTPATIENT_CLINIC_OR_DEPARTMENT_OTHER)
Admission: RE | Disposition: A | Discharge status: Home / Self Care | Payer: Self-pay | Source: Ambulatory Visit | Attending: Orthopedic Surgery

## 2014-08-02 DIAGNOSIS — G43909 Migraine, unspecified, not intractable, without status migrainosus: Secondary | ICD-10-CM | POA: Diagnosis not present

## 2014-08-02 DIAGNOSIS — Z791 Long term (current) use of non-steroidal anti-inflammatories (NSAID): Secondary | ICD-10-CM | POA: Diagnosis not present

## 2014-08-02 DIAGNOSIS — J302 Other seasonal allergic rhinitis: Secondary | ICD-10-CM | POA: Diagnosis not present

## 2014-08-02 DIAGNOSIS — Z7951 Long term (current) use of inhaled steroids: Secondary | ICD-10-CM | POA: Diagnosis not present

## 2014-08-02 DIAGNOSIS — Z79899 Other long term (current) drug therapy: Secondary | ICD-10-CM | POA: Diagnosis not present

## 2014-08-02 DIAGNOSIS — F329 Major depressive disorder, single episode, unspecified: Secondary | ICD-10-CM | POA: Diagnosis not present

## 2014-08-02 DIAGNOSIS — K219 Gastro-esophageal reflux disease without esophagitis: Secondary | ICD-10-CM | POA: Insufficient documentation

## 2014-08-02 DIAGNOSIS — G5601 Carpal tunnel syndrome, right upper limb: Secondary | ICD-10-CM | POA: Diagnosis present

## 2014-08-02 DIAGNOSIS — F419 Anxiety disorder, unspecified: Secondary | ICD-10-CM | POA: Diagnosis not present

## 2014-08-02 DIAGNOSIS — F17210 Nicotine dependence, cigarettes, uncomplicated: Secondary | ICD-10-CM | POA: Diagnosis not present

## 2014-08-02 HISTORY — DX: Family history of other specified conditions: Z84.89

## 2014-08-02 HISTORY — DX: Carpal tunnel syndrome, right upper limb: G56.01

## 2014-08-02 HISTORY — DX: Personal history of urinary calculi: Z87.442

## 2014-08-02 HISTORY — PX: CARPAL TUNNEL RELEASE: SHX101

## 2014-08-02 HISTORY — DX: Gastro-esophageal reflux disease without esophagitis: K21.9

## 2014-08-02 HISTORY — DX: Anxiety disorder, unspecified: F41.9

## 2014-08-02 LAB — POCT PREGNANCY, URINE: PREG TEST UR: NEGATIVE

## 2014-08-02 LAB — POCT HEMOGLOBIN-HEMACUE
HEMOGLOBIN: 11.6 g/dL — AB (ref 12.0–15.0)
Hemoglobin: 8 g/dL — ABNORMAL LOW (ref 12.0–15.0)

## 2014-08-02 SURGERY — CARPAL TUNNEL RELEASE
Anesthesia: Monitor Anesthesia Care | Site: Hand | Laterality: Right

## 2014-08-02 MED ORDER — CEFAZOLIN SODIUM-DEXTROSE 2-3 GM-% IV SOLR
INTRAVENOUS | Status: AC
  Filled 2014-08-02: qty 50

## 2014-08-02 MED ORDER — DEXAMETHASONE SODIUM PHOSPHATE 4 MG/ML IJ SOLN
INTRAMUSCULAR | Status: DC | PRN
  Administered 2014-08-02: 10 mg via INTRAVENOUS

## 2014-08-02 MED ORDER — METOCLOPRAMIDE HCL 5 MG/ML IJ SOLN
INTRAMUSCULAR | Status: DC | PRN
  Administered 2014-08-02: 5 mg via INTRAVENOUS

## 2014-08-02 MED ORDER — FAMOTIDINE IN NACL 20-0.9 MG/50ML-% IV SOLN
20.0000 mg | Freq: Two times a day (BID) | INTRAVENOUS | Status: DC
  Administered 2014-08-02: 20 mg via INTRAVENOUS
  Filled 2014-08-02: qty 50

## 2014-08-02 MED ORDER — FENTANYL CITRATE (PF) 100 MCG/2ML IJ SOLN
INTRAMUSCULAR | Status: DC | PRN
  Administered 2014-08-02 (×2): 50 ug via INTRAVENOUS

## 2014-08-02 MED ORDER — CHLORHEXIDINE GLUCONATE 4 % EX LIQD
60.0000 mL | Freq: Once | CUTANEOUS | Status: DC
  Filled 2014-08-02: qty 60

## 2014-08-02 MED ORDER — BUPIVACAINE HCL (PF) 0.25 % IJ SOLN
INTRAMUSCULAR | Status: DC | PRN
  Administered 2014-08-02: 10 mL

## 2014-08-02 MED ORDER — FENTANYL CITRATE (PF) 100 MCG/2ML IJ SOLN
INTRAMUSCULAR | Status: AC
  Filled 2014-08-02: qty 2

## 2014-08-02 MED ORDER — MIDAZOLAM HCL 5 MG/5ML IJ SOLN
INTRAMUSCULAR | Status: DC | PRN
  Administered 2014-08-02: 2 mg via INTRAVENOUS
  Administered 2014-08-02: 1 mg via INTRAVENOUS

## 2014-08-02 MED ORDER — PROPOFOL 500 MG/50ML IV EMUL
INTRAVENOUS | Status: DC | PRN
  Administered 2014-08-02: 50 ug/kg/min via INTRAVENOUS

## 2014-08-02 MED ORDER — HYDROCODONE-ACETAMINOPHEN 5-300 MG PO TABS: 1.0000 | ORAL_TABLET | Freq: Four times a day (QID) | ORAL | Status: AC | PRN

## 2014-08-02 MED ORDER — FENTANYL CITRATE (PF) 100 MCG/2ML IJ SOLN
25.0000 ug | INTRAMUSCULAR | Status: DC | PRN
  Filled 2014-08-02: qty 1

## 2014-08-02 MED ORDER — MIDAZOLAM HCL 2 MG/2ML IJ SOLN
INTRAMUSCULAR | Status: AC
  Filled 2014-08-02: qty 2

## 2014-08-02 MED ORDER — ONDANSETRON HCL 4 MG/2ML IJ SOLN
INTRAMUSCULAR | Status: DC | PRN
  Administered 2014-08-02: 4 mg via INTRAVENOUS

## 2014-08-02 MED ORDER — LIDOCAINE HCL (PF) 1 % IJ SOLN
INTRAMUSCULAR | Status: DC | PRN
  Administered 2014-08-02: 10 mL

## 2014-08-02 MED ORDER — LACTATED RINGERS IV SOLN
INTRAVENOUS | Status: DC
  Administered 2014-08-02: 12:00:00 via INTRAVENOUS
  Filled 2014-08-02: qty 1000

## 2014-08-02 MED ORDER — LACTATED RINGERS IV SOLN
INTRAVENOUS | Status: DC
  Filled 2014-08-02: qty 1000

## 2014-08-02 MED ORDER — LIDOCAINE HCL (CARDIAC) 20 MG/ML IV SOLN
INTRAVENOUS | Status: DC | PRN
  Administered 2014-08-02: 50 mg via INTRAVENOUS

## 2014-08-02 MED ORDER — CEFAZOLIN SODIUM-DEXTROSE 2-3 GM-% IV SOLR
2.0000 g | INTRAVENOUS | Status: AC
  Administered 2014-08-02: 2 g via INTRAVENOUS
  Filled 2014-08-02: qty 50

## 2014-08-02 SURGICAL SUPPLY — 35 items
BANDAGE ELASTIC 3 VELCRO ST LF (GAUZE/BANDAGES/DRESSINGS) ×3 IMPLANT
BLADE SURG 15 STRL LF DISP TIS (BLADE) ×1 IMPLANT
BLADE SURG 15 STRL SS (BLADE) ×2
BNDG CONFORM 3 STRL LF (GAUZE/BANDAGES/DRESSINGS) ×3 IMPLANT
BNDG ESMARK 4X9 LF (GAUZE/BANDAGES/DRESSINGS) ×3 IMPLANT
CORDS BIPOLAR (ELECTRODE) ×3 IMPLANT
COVER BACK TABLE 60X90IN (DRAPES) ×3 IMPLANT
CUFF TOURNIQUET SINGLE 18IN (TOURNIQUET CUFF) ×3 IMPLANT
DRAPE EXTREMITY T 121X128X90 (DRAPE) ×3 IMPLANT
DRAPE LG THREE QUARTER DISP (DRAPES) ×3 IMPLANT
DRAPE SURG 17X23 STRL (DRAPES) ×3 IMPLANT
DRSG EMULSION OIL 3X3 NADH (GAUZE/BANDAGES/DRESSINGS) IMPLANT
GAUZE XEROFORM 1X8 LF (GAUZE/BANDAGES/DRESSINGS) ×3 IMPLANT
GLOVE BIO SURGEON STRL SZ8 (GLOVE) ×3 IMPLANT
GLOVE BIOGEL PI IND STRL 8.5 (GLOVE) ×1 IMPLANT
GLOVE BIOGEL PI INDICATOR 8.5 (GLOVE) ×2
GOWN STRL REUS W/ TWL LRG LVL3 (GOWN DISPOSABLE) ×2 IMPLANT
GOWN STRL REUS W/TWL LRG LVL3 (GOWN DISPOSABLE) ×4
KNIFE CARPAL TUNNEL (BLADE) IMPLANT
NDL SAFETY ECLIPSE 18X1.5 (NEEDLE) ×1 IMPLANT
NEEDLE HYPO 18GX1.5 SHARP (NEEDLE) ×2
NEEDLE HYPO 25X1 1.5 SAFETY (NEEDLE) ×6 IMPLANT
NS IRRIG 500ML POUR BTL (IV SOLUTION) ×3 IMPLANT
PACK BASIN DAY SURGERY FS (CUSTOM PROCEDURE TRAY) ×3 IMPLANT
PAD ALCOHOL SWAB (MISCELLANEOUS) ×12 IMPLANT
PAD CAST 3X4 CTTN HI CHSV (CAST SUPPLIES) IMPLANT
PADDING CAST COTTON 3X4 STRL (CAST SUPPLIES)
SPONGE GAUZE 4X4 12PLY STER LF (GAUZE/BANDAGES/DRESSINGS) ×3 IMPLANT
STOCKINETTE 4X48 STRL (DRAPES) ×3 IMPLANT
SUT PROLENE 4 0 PS 2 18 (SUTURE) ×3 IMPLANT
SYR BULB 3OZ (MISCELLANEOUS) ×3 IMPLANT
SYR CONTROL 10ML LL (SYRINGE) ×6 IMPLANT
TOWEL OR 17X24 6PK STRL BLUE (TOWEL DISPOSABLE) ×3 IMPLANT
TRAY DSU PREP LF (CUSTOM PROCEDURE TRAY) ×3 IMPLANT
UNDERPAD 30X30 INCONTINENT (UNDERPADS AND DIAPERS) ×3 IMPLANT

## 2014-08-02 NOTE — Discharge Instructions (Signed)
KEEP BANDAGE CLEAN AND DRY °CALL OFFICE FOR F/U APPT [phone] IN 14 DAYS °KEEP HAND ELEVATED ABOVE HEART °OK TO APPLY ICE TO OPERATIVE AREA °CONTACT OFFICE IF ANY WORSENING PAIN OR CONCERNS.Call your surgeon if you experience:  ° °1.  Fever over 101.0. °2.  Inability to urinate. °3.  Nausea and/or vomiting. °4.  Extreme swelling or bruising at the surgical site. °5.  Continued bleeding from the incision. °6.  Increased pain, redness or drainage from the incision. °7.  Problems related to your pain medication. °8. Any change in color, movement and/or sensation °9. Any problems and/or concerns ° °Post Anesthesia Home Care Instructions ° °Activity: °Get plenty of rest for the remainder of the day. A responsible adult should stay with you for 24 hours following the procedure.  °For the next 24 hours, DO NOT: °-Drive a car °-Operate machinery °-Drink alcoholic beverages °-Take any medication unless instructed by your physician °-Make any legal decisions or sign important papers. ° °Meals: °Start with liquid foods such as gelatin or soup. Progress to regular foods as tolerated. Avoid greasy, spicy, heavy foods. If nausea and/or vomiting occur, drink only clear liquids until the nausea and/or vomiting subsides. Call your physician if vomiting continues. ° °Special Instructions/Symptoms: °Your throat may feel dry or sore from the anesthesia or the breathing tube placed in your throat during surgery. If this causes discomfort, gargle with warm salt water. The discomfort should disappear within 24 hours. ° °If you had a scopolamine patch placed behind your ear for the management of post- operative nausea and/or vomiting: ° °1. The medication in the patch is effective for 72 hours, after which it should be removed.  Wrap patch in a tissue and discard in the trash. Wash hands thoroughly with soap and water. °2. You may remove the patch earlier than 72 hours if you experience unpleasant side effects which may include dry mouth,  dizziness or visual disturbances. °3. Avoid touching the patch. Wash your hands with soap and water after contact with the patch. °  ° °

## 2014-08-02 NOTE — Anesthesia Postprocedure Evaluation (Signed)
  Anesthesia Post-op Note  Patient: Angela SporeKristina Rivera  Procedure(s) Performed: Procedure(s) (LRB): RIGHT HAND CARPAL TUNNEL RELEASE (Right)  Patient Location: PACU  Anesthesia Type: MAC and General  Level of Consciousness: awake and alert   Airway and Oxygen Therapy: Patient Spontanous Breathing  Post-op Pain: mild  Post-op Assessment: Post-op Vital signs reviewed, Patient's Cardiovascular Status Stable, Respiratory Function Stable, Patent Airway and No signs of Nausea or vomiting  Last Vitals:  Filed Vitals:   08/02/14 1411  BP: 111/58  Pulse: 78  Temp: 36.6 C  Resp: 12    Post-op Vital Signs: stable   Complications: No apparent anesthesia complications

## 2014-08-02 NOTE — Brief Op Note (Signed)
08/02/2014  1:24 PM  PATIENT:  Angela Rivera  29 y.o. female  PRE-OPERATIVE DIAGNOSIS:  RIGHT HAND CARPEL TUNNEL SYNDROME   POST-OPERATIVE DIAGNOSIS:  * No post-op diagnosis entered *  PROCEDURE:  Procedure(s): RIGHT HAND CARPAL TUNNEL RELEASE (Right)  SURGEON:  Surgeon(s) and Role:    * Bradly BienenstockFred Jaidence Geisler, MD - Primary  PHYSICIAN ASSISTANT:   ASSISTANTS: none   ANESTHESIA:   MAC  EBL:     BLOOD ADMINISTERED:none  DRAINS: none   LOCAL MEDICATIONS USED:  MARCAINE     SPECIMEN:  No Specimen  DISPOSITION OF SPECIMEN:  N/A  COUNTS:  YES  TOURNIQUET:    DICTATION: . 811914. [id number] PLAN OF CARE: Discharge to home after PACU  PATIENT DISPOSITION:  PACU - hemodynamically stable.   Delay start of Pharmacological VTE agent (>24hrs) due to surgical blood loss or risk of bleeding: not applicable

## 2014-08-02 NOTE — H&P (Signed)
Angela Rivera is an 29 y.o. female.   Chief Complaint: s/s c/w right hand carpal tunnel syndrome HPI: Pt followed in office Pt here for surgery on right hand No prior surgery to right   Past Medical History  Diagnosis Date  . Seasonal allergies   . Migraines   . Depression   . Carpal tunnel syndrome on right   . History of kidney stones   . Family history of adverse reaction to anesthesia     Mother--  PONV  . Anxiety   . GERD (gastroesophageal reflux disease)     Past Surgical History  Procedure Laterality Date  . Mouth surgery  age 88    Family History  Problem Relation Age of Onset  . Alcoholism Mother   . Alcoholism Father   . Alcohol abuse      grandparent  . Arthritis    . Ovarian cancer Other   . Colon cancer Other   . Breast cancer      grandparent  . Lung cancer Other   . Hyperlipidemia Mother   . Heart disease      grandparent  . Stroke Other   . Hypertension Mother   . Bipolar disorder Mother   . Bipolar disorder Sister   . Cirrhosis      aunt, uncle, and grandmother on father's side  . Hepatitis      aunt and grandmother  . Colon polyps Mother    Social History:  reports that she has been smoking Cigarettes.  She has a 15 pack-year smoking history. She has never used smokeless tobacco. She reports that she drinks alcohol. Her drug history is not on file.  Allergies: No Known Allergies  Medications Prior to Admission  Medication Sig Dispense Refill  . cetirizine (ZYRTEC) 10 MG tablet Take 10 mg by mouth every morning.    . fluticasone (FLONASE) 50 MCG/ACT nasal spray Place 1 spray into both nostrils daily as needed for allergies or rhinitis.    Marland Kitchen ibuprofen (ADVIL,MOTRIN) 200 MG tablet Take 800 mg by mouth every 6 (six) hours as needed.    . Multiple Vitamin (MULTIVITAMIN) tablet Take 1 tablet by mouth daily.    . Sertraline HCl (ZOLOFT PO) Take 150 mg by mouth every morning.    Marland Kitchen acetaminophen (TYLENOL) 500 MG tablet Take 500 mg by mouth  every 6 (six) hours as needed.    . clonazePAM (KLONOPIN) 0.5 MG tablet Take 0.5 mg by mouth 3 (three) times daily as needed for anxiety.      Results for orders placed or performed during the hospital encounter of 08/02/14 (from the past 48 hour(s))  Pregnancy, urine POC     Status: None   Collection Time: 08/02/14 11:53 AM  Result Value Ref Range   Preg Test, Ur NEGATIVE NEGATIVE    Comment:        THE SENSITIVITY OF THIS METHODOLOGY IS >24 mIU/mL   Hemoglobin-hemacue, POC     Status: Abnormal   Collection Time: 08/02/14 12:27 PM  Result Value Ref Range   Hemoglobin 8.0 (L) 12.0 - 15.0 g/dL  Hemoglobin-hemacue, POC     Status: Abnormal   Collection Time: 08/02/14 12:32 PM  Result Value Ref Range   Hemoglobin 11.6 (L) 12.0 - 15.0 g/dL   No results found.  ROS NO RECENT ILLNESSES OR HOSPITALIZATIONS  Blood pressure 121/70, pulse 87, temperature 98.6 F (37 C), temperature source Oral, resp. rate 16, height  (1.626 m), weight 94.348 kg (208  lb), last menstrual period 07/13/2014, SpO2 99 %. Physical Exam  General Appearance:  Alert, cooperative, no distress, appears stated age  Head:  Normocephalic, without obvious abnormality, atraumatic  Eyes:  Pupils equal, conjunctiva/corneas clear,         Throat: Lips, mucosa, and tongue normal; teeth and gums normal  Neck: No visible masses     Lungs:   respirations unlabored  Chest Wall:  No tenderness or deformity  Heart:  Regular rate and rhythm,  Abdomen:   Soft, non-tender,         Extremities: RIGHT HAND: NO THENAR MUSCLE ATROPHY FINGERS WARM WELL PERFUSED GOOD CAP REFIL +CT COMPRESSION TEST  Pulses: 2+ and symmetric  Skin: Skin color, texture, turgor normal, no rashes or lesions     Neurologic: Normal    Assessment/Plan RIGHT HAND CARPAL TUNNEL SYNDROME  RIGHT HAND CARPAL TUNNEL RELEASE  R/B/A DISCUSSED WITH PT IN OFFICE.  PT VOICED UNDERSTANDING OF PLAN CONSENT SIGNED DAY OF SURGERY PT SEEN AND EXAMINED  PRIOR TO OPERATIVE PROCEDURE/DAY OF SURGERY SITE MARKED. QUESTIONS ANSWERED WILL GO HOME FOLLOWING SURGERY  WE ARE PLANNING SURGERY FOR YOUR UPPER EXTREMITY. THE RISKS AND BENEFITS OF SURGERY INCLUDE BUT NOT LIMITED TO BLEEDING INFECTION, DAMAGE TO NEARBY NERVES ARTERIES TENDONS, FAILURE OF SURGERY TO ACCOMPLISH ITS INTENDED GOALS, PERSISTENT SYMPTOMS AND NEED FOR FURTHER SURGICAL INTERVENTION. WITH THIS IN MIND WE WILL PROCEED. I HAVE DISCUSSED WITH THE PATIENT THE PRE AND POSTOPERATIVE REGIMEN AND THE DOS AND DON'TS. PT VOICED UNDERSTANDING AND INFORMED CONSENT SIGNED.  Sharma CovertTMANN,Dontee Jaso W 08/02/2014, 1:22 PM

## 2014-08-02 NOTE — Anesthesia Preprocedure Evaluation (Addendum)
Anesthesia Evaluation  Patient identified by MRN, date of birth, ID band Patient awake    Reviewed: Allergy & Precautions, H&P , NPO status , Patient's Chart, lab work & pertinent test results  Airway Mallampati: II  TM Distance: >3 FB Neck ROM: full    Dental no notable dental hx. (+) Teeth Intact, Dental Advisory Given   Pulmonary Current Smoker,  breath sounds clear to auscultation  Pulmonary exam normal       Cardiovascular Exercise Tolerance: Good negative cardio ROS  Rhythm:regular Rate:Normal     Neuro/Psych Anxiety Depression negative neurological ROS  negative psych ROS   GI/Hepatic negative GI ROS, Neg liver ROS,   Endo/Other  negative endocrine ROS  Renal/GU negative Renal ROS  negative genitourinary   Musculoskeletal   Abdominal   Peds  Hematology negative hematology ROS (+)   Anesthesia Other Findings   Reproductive/Obstetrics negative OB ROS                           Anesthesia Physical Anesthesia Plan  ASA: II  Anesthesia Plan: MAC   Post-op Pain Management:    Induction:   Airway Management Planned: Simple Face Mask  Additional Equipment:   Intra-op Plan:   Post-operative Plan:   Informed Consent: I have reviewed the patients History and Physical, chart, labs and discussed the procedure including the risks, benefits and alternatives for the proposed anesthesia with the patient or authorized representative who has indicated his/her understanding and acceptance.   Dental Advisory Given  Plan Discussed with: CRNA and Surgeon  Anesthesia Plan Comments:        Anesthesia Quick Evaluation

## 2014-08-02 NOTE — Anesthesia Procedure Notes (Signed)
Procedure Name: MAC Date/Time: 08/02/2014 1:30 PM Performed by: Norva PavlovALLAWAY, Kaede Clendenen G Pre-anesthesia Checklist: Patient identified, Timeout performed, Emergency Drugs available, Suction available and Patient being monitored Patient Re-evaluated:Patient Re-evaluated prior to inductionOxygen Delivery Method: Nasal cannula Intubation Type: IV induction Placement Confirmation: positive ETCO2

## 2014-08-02 NOTE — Transfer of Care (Signed)
  Last Vitals:  Filed Vitals:   08/02/14 1202  BP: 121/70  Pulse: 87  Temp: 37 C  Resp: 16    Immediate Anesthesia Transfer of Care Note  Patient: Angela SporeKristina Rivera  Procedure(s) Performed: Procedure(s) (LRB): RIGHT HAND CARPAL TUNNEL RELEASE (Right)  Patient Location: PACU  Anesthesia Type: MAC  Level of Consciousness: awake, alert  and oriented  Airway & Oxygen Therapy: Patient Spontanous Breathing and Patient connected to nasal cannula oxygen  Post-op Assessment: Report given to PACU RN and Post -op Vital signs reviewed and stable  Post vital signs: Reviewed and stable  Complications: No apparent anesthesia complications

## 2014-08-03 ENCOUNTER — Encounter (HOSPITAL_BASED_OUTPATIENT_CLINIC_OR_DEPARTMENT_OTHER): Payer: Self-pay | Admitting: Orthopedic Surgery

## 2014-08-03 NOTE — Op Note (Signed)
NAMLutricia Feil:  Rivera, Angela Rivera         ACCOUNT NO.:  [id number]  MEDICAL RECORD NO.:  [id number]  LOCATION:                                 FACILITY:  PHYSICIAN:  Madelynn DoneFred W Shera Laubach IV, MD  DATE OF BIRTH:  June 10, 1985  DATE OF PROCEDURE:  08/02/2014 DATE OF DISCHARGE:  08/02/2014                              OPERATIVE REPORT   PREOPERATIVE DIAGNOSIS:  Right hand carpal tunnel syndrome.  POSTOPERATIVE DIAGNOSIS:  Right hand carpal tunnel syndrome.  ATTENDING PHYSICIAN:  Sharma CovertFred W. Chrles Selley IV, MD, who was scrubbed and present for the entire procedure.  ASSISTANT SURGEON:  None.  ANESTHESIA:  Xylocaine 1% and 0.25% Marcaine local block with IV sedation.  SURGICAL PROCEDURE:  Right hand carpal tunnel release.  SURGICAL INDICATIONS:  [name] is a right-hand-dominant female with signs and symptoms consistent with a right hand carpal tunnel syndrome.  This has been refractory to conservative treatment.  Risks, benefits, and alternatives were discussed in detail with the patient, and signed informed consent was obtained.  Risks include, but not limited to bleeding; infection; damage to nearby nerves, arteries, tendons; loss of motion of the wrist and digits; incomplete relief of symptoms; and need for further surgical intervention.  DESCRIPTION OF PROCEDURE:  The patient was properly identified in the preop holding area and marked with a permanent marker made on the right hand to indicate the correct operative site.  The patient was then brought back to the operating room, placed supine on anesthesia table where the IV sedation was administered.  The patient tolerated this well.  A well-padded tourniquet was then placed on the right forearm and sealed with 1000 drape.  The right upper extremity was then prepped and draped in normal sterile fashion.  Time-out was called and correct site was identified, and the procedure was then begun.  Attention was then turned to the right hand.  The  limb was elevated and tourniquet insufflated.  Several centimeters of incision was made directly in the mid palm.  Dissection carried down through the skin and subcutaneous tissue.  The palmar fascia was incised longitudinally.  Direct exposure of the transverse carpal ligament  was carried out under direct visualization, distal one-half of the transverse carpal ligament released in its entirety.  Further exposure was then carried out proximally on the remaining course of transverse carpal ligament as well as portion of the antebrachial fascia was then released.  Contents of the carpal canal were inspected.  No other abnormalities noted.  The wound was then thoroughly irrigated.  The tourniquet was deflated. Hemostasis was  obtained.  Skin was closed using horizontal mattress 4-0 nylon sutures.  Xeroform dressing and sterile compressive bandage were applied.  The patient tolerated the procedure well, returned to the recovery room in good condition.  POSTPROCEDURE PLAN:  The patient to be discharged home, seen back in the office in approximately 2 weeks for wound check, suture removal, padded glove, and then begin postoperative carpal tunnel eval and treat.     Madelynn DoneFred W Cindel Daugherty IV, MD     FWO/MEDQ  D:  08/02/2014  T:  08/03/2014  Job:  [id number]

## 2016-09-21 ENCOUNTER — Encounter (HOSPITAL_COMMUNITY): Payer: Self-pay | Admitting: Emergency Medicine

## 2016-09-21 ENCOUNTER — Emergency Department (HOSPITAL_COMMUNITY): Payer: Managed Care, Other (non HMO)

## 2016-09-21 DIAGNOSIS — R0789 Other chest pain: Secondary | ICD-10-CM | POA: Insufficient documentation

## 2016-09-21 DIAGNOSIS — F17210 Nicotine dependence, cigarettes, uncomplicated: Secondary | ICD-10-CM | POA: Insufficient documentation

## 2016-09-21 DIAGNOSIS — R42 Dizziness and giddiness: Secondary | ICD-10-CM | POA: Insufficient documentation

## 2016-09-21 LAB — BASIC METABOLIC PANEL
Anion gap: 8 (ref 5–15)
BUN: 13 mg/dL (ref 6–20)
CO2: 21 mmol/L — AB (ref 22–32)
CREATININE: 0.91 mg/dL (ref 0.44–1.00)
Calcium: 9.4 mg/dL (ref 8.9–10.3)
Chloride: 105 mmol/L (ref 101–111)
GLUCOSE: 109 mg/dL — AB (ref 65–99)
Potassium: 3.4 mmol/L — ABNORMAL LOW (ref 3.5–5.1)
Sodium: 134 mmol/L — ABNORMAL LOW (ref 135–145)

## 2016-09-21 LAB — CBC
HCT: 36.1 % (ref 36.0–46.0)
Hemoglobin: 12.4 g/dL (ref 12.0–15.0)
MCH: 30.9 pg (ref 26.0–34.0)
MCHC: 34.3 g/dL (ref 30.0–36.0)
MCV: 90 fL (ref 78.0–100.0)
PLATELETS: 278 10*3/uL (ref 150–400)
RBC: 4.01 MIL/uL (ref 3.87–5.11)
RDW: 13.3 % (ref 11.5–15.5)
WBC: 11.5 10*3/uL — ABNORMAL HIGH (ref 4.0–10.5)

## 2016-09-21 LAB — I-STAT TROPONIN, ED: TROPONIN I, POC: 0 ng/mL (ref 0.00–0.08)

## 2016-09-21 NOTE — ED Triage Notes (Signed)
P presents to ED for assessment of left sided chest pain, with radiation to the back, p states she has had a lot of burping and gas.  Patient states she had a headache earlier, took 4 ibuprofen, headache went away, and then she was sitting on the front porch this evening smoking a cigarette after a large dinner, and experienced chest pain, shortness of breath, anxiety and dizziness.  Symptoms have lessened, but will have episodes of flair up.

## 2016-09-22 ENCOUNTER — Other Ambulatory Visit: Payer: Self-pay

## 2016-09-22 ENCOUNTER — Emergency Department (HOSPITAL_COMMUNITY)
Admission: EM | Admit: 2016-09-22 | Discharge: 2016-09-22 | Disposition: A | Discharge status: Home / Self Care | Payer: Managed Care, Other (non HMO) | Attending: Emergency Medicine | Admitting: Emergency Medicine

## 2016-09-22 DIAGNOSIS — R0789 Other chest pain: Secondary | ICD-10-CM

## 2016-09-22 DIAGNOSIS — R42 Dizziness and giddiness: Secondary | ICD-10-CM

## 2016-09-22 LAB — I-STAT TROPONIN, ED: Troponin i, poc: 0 ng/mL (ref 0.00–0.08)

## 2016-09-22 LAB — D-DIMER, QUANTITATIVE (NOT AT ARMC): D-Dimer, Quant: 0.39 ug/mL-FEU (ref 0.00–0.50)

## 2016-09-22 MED ORDER — SODIUM CHLORIDE 0.9 % IV BOLUS (SEPSIS)
1000.0000 mL | Freq: Once | INTRAVENOUS | Status: AC
  Administered 2016-09-22: 1000 mL via INTRAVENOUS

## 2016-09-22 MED ORDER — ACETAMINOPHEN 325 MG PO TABS
650.0000 mg | ORAL_TABLET | Freq: Once | ORAL | Status: AC
  Administered 2016-09-22: 650 mg via ORAL
  Filled 2016-09-22: qty 2

## 2016-09-22 NOTE — ED Provider Notes (Signed)
[facility]-EMERGENCY DEPT Provider Note   CSN: [id number] Arrival date & time: 09/21/16  2107   By signing my name below, I, Freida Busman, attest that this documentation has been prepared under the direction and in the presence of No att. providers found . Electronically Signed: Freida Busman, Scribe. 09/22/2016. 2:12 AM.   History   Chief Complaint Chief Complaint  Patient presents with  . Chest Pain  . Dizziness    The history is provided by the patient. No language interpreter was used.     HPI Comments:  Angela Rivera is a 31 y.o. female who presents to the Emergency Department complaining of dizziness that began ~6 hours ago. Pt states she went outside to smoke a cigarette after dinner when her dizziness began. She has a h/o similar milder episodes but states this episode has persisted.She notes associated SOB and chest discomfort after the dizziness persisted; states she felt panicked. She specifically notes pain to the epigastric region and upper chest. She thinks she also ate too much and the dinner could have caused it. Pt also notes HA early yesterday afternoon. She took 4 ibuprofen with relief; HA improved prior to onset of dizziness. No recent long periods of immobilization. No lower extremity swelling or pain.    Past Medical History:  Diagnosis Date  . Anxiety   . Carpal tunnel syndrome on right   . Depression   . Family history of adverse reaction to anesthesia    Mother--  PONV  . GERD (gastroesophageal reflux disease)   . History of kidney stones   . Migraines   . Seasonal allergies     There are no active problems to display for this patient.   Past Surgical History:  Procedure Laterality Date  . CARPAL TUNNEL RELEASE Right 08/02/2014   Procedure: RIGHT HAND CARPAL TUNNEL RELEASE;  Surgeon: Bradly Bienenstock, MD;  Location: [facility];  Service: Orthopedics;  Laterality: Right;  . MOUTH SURGERY  age 32    OB History    No data available       Home Medications    Prior to Admission medications   Medication Sig Start Date End Date Taking? Authorizing Provider  cetirizine (ZYRTEC) 10 MG tablet Take 10 mg by mouth every morning.    [provider]  clonazePAM (KLONOPIN) 0.5 MG tablet Take 0.5 mg by mouth 3 (three) times daily as needed for anxiety.    [provider]  fluticasone (FLONASE) 50 MCG/ACT nasal spray Place 1 spray into both nostrils daily as needed for allergies or rhinitis.    [provider]  Hydrocodone-Acetaminophen (VICODIN) 5-300 MG TABS Take 1 tablet by mouth 4 (four) times daily as needed (PAIN). 08/02/14   Bradly Bienenstock, MD  ibuprofen (ADVIL,MOTRIN) 200 MG tablet Take 800 mg by mouth every 6 (six) hours as needed.    [provider]  Multiple Vitamin (MULTIVITAMIN) tablet Take 1 tablet by mouth daily.    [provider]  Sertraline HCl (ZOLOFT PO) Take 150 mg by mouth every morning.    [provider]    Family History Family History  Problem Relation Age of Onset  . Alcoholism Mother   . Hyperlipidemia Mother   . Hypertension Mother   . Bipolar disorder Mother   . Colon polyps Mother   . Alcoholism Father   . Alcohol abuse Unknown        grandparent  . Arthritis Unknown   . Ovarian cancer Other   . Colon  cancer Other   . Breast cancer Unknown        grandparent  . Lung cancer Other   . Heart disease Unknown        grandparent  . Stroke Other   . Bipolar disorder Sister   . Cirrhosis Unknown        aunt, uncle, and grandmother on father's side  . Hepatitis Unknown        aunt and grandmother    Social History Social History  Substance Use Topics  . Smoking status: Current Every Day Smoker    Packs/day: 1.00    Years: 15.00    Types: Cigarettes  . Smokeless tobacco: Never Used  . Alcohol use Yes     Comment: rare     Allergies   Patient has no known allergies.   Review of Systems Review of Systems  Respiratory: Positive  for shortness of breath.   Cardiovascular: Positive for chest pain.  Gastrointestinal: Negative for abdominal pain.  Neurological: Positive for dizziness and headaches (resolved). Negative for weakness.  All other systems reviewed and are negative.    Physical Exam Updated Vital Signs BP (!) 125/44   Pulse 96   Temp 98.2 F (36.8 C) (Oral)   Resp 18   LMP 09/08/2016   SpO2 98%   Physical Exam  Constitutional: She is oriented to person, place, and time. She appears well-developed and well-nourished.  HENT:  Head: Normocephalic and atraumatic.  Right Ear: External ear normal.  Left Ear: External ear normal.  Nose: Nose normal.  Eyes: EOM are normal. Pupils are equal, round, and reactive to light. Right eye exhibits no discharge. Left eye exhibits no discharge.  Neck: Normal range of motion. Neck supple.  Cardiovascular: Normal rate, regular rhythm and normal heart sounds.   Pulmonary/Chest: Effort normal and breath sounds normal.  Abdominal: Soft. There is no tenderness.  Neurological: She is alert and oriented to person, place, and time.  CN 3-12 grossly intact. 5/5 strength in all 4 extremities. Grossly normal sensation. Normal finger to nose.   Skin: Skin is warm and dry.  Nursing note and vitals reviewed.    ED Treatments / Results  DIAGNOSTIC STUDIES:  Oxygen Saturation is 100% on RA, normal by my interpretation.    COORDINATION OF CARE:  2:20 AM Discussed treatment plan with pt at bedside and pt agreed to plan.   Labs (all labs ordered are listed, but only abnormal results are displayed) Labs Reviewed  BASIC METABOLIC PANEL - Abnormal; Notable for the following:       Result Value   Sodium 134 (*)    Potassium 3.4 (*)    CO2 21 (*)    Glucose, Bld 109 (*)    All other components within normal limits  CBC - Abnormal; Notable for the following:    WBC 11.5 (*)    All other components within normal limits  D-DIMER, QUANTITATIVE (NOT AT [facility])  I-STAT  TROPOININ, ED  I-STAT TROPOININ, ED    EKG  EKG Interpretation  Date/Time:  Monday September 21 2016 21:14:47 EDT Ventricular Rate:  101 PR Interval:  120 QRS Duration: 94 QT Interval:  370 QTC Calculation: 479 R Axis:   64 Text Interpretation:  Sinus tachycardia Nonspecific T wave abnormality Abnormal ECG rate is faster, nonspecific T waves similar to 2014 Confirmed by Pricilla Loveless [phone]) on 09/22/2016 1:50:29 AM       EKG Interpretation  Date/Time:  Tuesday September 22 2016 02:08:07 EDT Ventricular  Rate:  85 PR Interval:  120 QRS Duration: 86 QT Interval:  382 QTC Calculation: 455 R Axis:   67 Text Interpretation:  Normal sinus rhythm no significant change since yesterday. Confirmed by Pricilla LovelessGoldston, Serria Sloma [phone]([id number]) on 09/22/2016 2:40:47 AM       Radiology Dg Chest 2 View  Result Date: 09/21/2016 CLINICAL DATA:  Left arm pain and dizziness with dyspnea 1 hour ago. EXAM: CHEST  2 VIEW COMPARISON:  None. FINDINGS: The heart size and mediastinal contours are within normal limits. Both lungs are clear. The visualized skeletal structures are unremarkable. IMPRESSION: No active cardiopulmonary disease. Electronically Signed   By: Tollie Ethavid  Kwon M.D.   On: 09/21/2016 21:54    Procedures Procedures (including critical care time)  Medications Ordered in ED Medications  sodium chloride 0.9 % bolus 1,000 mL (0 mLs Intravenous Stopped 09/22/16 0404)  acetaminophen (TYLENOL) tablet 650 mg (650 mg Oral Given 09/22/16 0248)     Initial Impression / Assessment and Plan / ED Course  I have reviewed the triage vital signs and the nursing notes.  Pertinent labs & imaging results that were available during my care of the patient were reviewed by me and considered in my medical decision making (see chart for details).     Workup unremarkable at this time. Possible dehydration, she does tell me she rarely drinks water or doesn't drink enough. Feels better at this time. No signs of ACS, PE. Neuro exam  unremarkable. Well appearing, will d/c home with return precautions.  Final Clinical Impressions(s) / ED Diagnoses   Final diagnoses:  Dizziness  Atypical chest pain    New Prescriptions Discharge Medication List as of 09/22/2016  3:46 AM     I personally performed the services described in this documentation, which was scribed in my presence. The recorded information has been reviewed and is accurate.     Pricilla LovelessGoldston, Rekia Kujala, MD 09/22/16 1022

## 2016-09-22 NOTE — ED Notes (Signed)
Pt departed in NAD, refused use of wheelchair.  

## 2016-09-22 NOTE — ED Notes (Signed)
ED Provider at bedside. 

## 2016-12-03 ENCOUNTER — Encounter (HOSPITAL_COMMUNITY): Payer: Self-pay

## 2016-12-03 ENCOUNTER — Emergency Department (HOSPITAL_COMMUNITY)
Admission: EM | Admit: 2016-12-03 | Discharge: 2016-12-03 | Disposition: A | Discharge status: Home / Self Care | Payer: Managed Care, Other (non HMO) | Attending: Emergency Medicine | Admitting: Emergency Medicine

## 2016-12-03 DIAGNOSIS — F419 Anxiety disorder, unspecified: Secondary | ICD-10-CM | POA: Insufficient documentation

## 2016-12-03 DIAGNOSIS — F329 Major depressive disorder, single episode, unspecified: Secondary | ICD-10-CM | POA: Diagnosis not present

## 2016-12-03 DIAGNOSIS — Z79899 Other long term (current) drug therapy: Secondary | ICD-10-CM | POA: Insufficient documentation

## 2016-12-03 DIAGNOSIS — F17210 Nicotine dependence, cigarettes, uncomplicated: Secondary | ICD-10-CM | POA: Diagnosis not present

## 2016-12-03 DIAGNOSIS — Z203 Contact with and (suspected) exposure to rabies: Secondary | ICD-10-CM | POA: Insufficient documentation

## 2016-12-03 MED ORDER — RABIES IMMUNE GLOBULIN 150 UNIT/ML IM INJ
20.0000 [IU]/kg | INJECTION | Freq: Once | INTRAMUSCULAR | Status: AC
  Administered 2016-12-03: 1800 [IU] via INTRAMUSCULAR
  Filled 2016-12-03: qty 12

## 2016-12-03 MED ORDER — RABIES VACCINE, PCEC IM SUSR
1.0000 mL | Freq: Once | INTRAMUSCULAR | Status: AC
  Administered 2016-12-03: 1 mL via INTRAMUSCULAR
  Filled 2016-12-03: qty 1

## 2016-12-03 NOTE — Discharge Instructions (Signed)
Please follow up at [facility] for the following dates for your rabies vaccine series: Sunday: Aug 26th, 2018 Thursday: Aug 30th, 2018 Thursday: Sept 6th, 2018

## 2016-12-03 NOTE — ED Triage Notes (Signed)
Pt presents for rabies vaccine. States was scratched by kitten who she had to put to sleep due to suspected rabies. No open bite wounds, minor scratches to R hand.

## 2016-12-03 NOTE — ED Notes (Signed)
Declined W/C at D/C and was escorted to lobby by RN. 

## 2016-12-03 NOTE — ED Provider Notes (Signed)
[facility]-EMERG[facility]Y DEPT Provider Note   CSN: [id number] Arrival date & time: 12/03/16  0902     History   Chief Complaint Chief Complaint  Patient presents with  . Rabies Injection    HPI Angela Rivera is a 31 y.o. female.  HPI   31 year old female presenting for evaluation of potential rabies exposure. 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 The kitten was acting strange, foaming at the mouth and became more aggressive since last night.  She brought the kitten to the vet to have it put down.  The Vet recommend pt to get treated for potential rabies because pt has suffered multiple scratches to her hands, forearms, and left side of neck as well as exposure to the foaming saliva from the kitten.  Kitten not old enough for rabies vaccine yet.  Pt without other sxs, she is UTD with tdap.  No hx of HIV or other immune compromise state.   Past Medical History:  Diagnosis Date  . Anxiety   . Carpal tunnel syndrome on right   . Depression   . Family history of adverse reaction to anesthesia    Mother--  PONV  . GERD (gastroesophageal reflux disease)   . History of kidney stones   . Migraines   . Seasonal allergies     There are no active problems to display for this patient.   Past Surgical History:  Procedure Laterality Date  . CARPAL TUNNEL RELEASE Right 08/02/2014   Procedure: RIGHT HAND CARPAL TUNNEL RELEASE;  Surgeon: Bradly Bienenstock, MD;  Location: [facility];  Service: Orthopedics;  Laterality: Right;  . MOUTH SURGERY  age 59    OB History    No data available       Home Medications    Prior to Admission medications   Medication Sig Start Date End Date Taking? Authorizing Provider  cetirizine (ZYRTEC) 10 MG tablet Take 10 mg by mouth every morning.    [provider]  clonazePAM (KLONOPIN) 0.5 MG tablet Take 0.5 mg by mouth 3 (three) times daily as needed for anxiety.    [provider]  fluticasone (FLONASE)  50 MCG/ACT nasal spray Place 1 spray into both nostrils daily as needed for allergies or rhinitis.    [provider]  Hydrocodone-Acetaminophen (VICODIN) 5-300 MG TABS Take 1 tablet by mouth 4 (four) times daily as needed (PAIN). 08/02/14   Bradly Bienenstock, MD  ibuprofen (ADVIL,MOTRIN) 200 MG tablet Take 800 mg by mouth every 6 (six) hours as needed.    [provider]  Multiple Vitamin (MULTIVITAMIN) tablet Take 1 tablet by mouth daily.    [provider]  Sertraline HCl (ZOLOFT PO) Take 150 mg by mouth every morning.    [provider]    Family History Family History  Problem Relation Age of Onset  . Alcoholism Mother   . Hyperlipidemia Mother   . Hypertension Mother   . Bipolar disorder Mother   . Colon polyps Mother   . Alcoholism Father   . Alcohol abuse Unknown        grandparent  . Arthritis Unknown   . Ovarian cancer Other   . Colon cancer Other   . Breast cancer Unknown        grandparent  . Lung cancer Other   . Heart disease Unknown        grandparent  . Stroke Other   . Bipolar disorder Sister   . Cirrhosis Unknown  aunt, uncle, and grandmother on father's side  . Hepatitis Unknown        aunt and grandmother    Social History Social History  Substance Use Topics  . Smoking status: Current Every Day Smoker    Packs/day: 1.00    Years: 15.00    Types: Cigarettes  . Smokeless tobacco: Never Used  . Alcohol use Yes     Comment: rare     Allergies   Patient has no known allergies.   Review of Systems Review of Systems  All other systems reviewed and are negative.    Physical Exam Updated Vital Signs BP (!) 145/85 (BP Location: Right Arm)   Pulse 100   Temp 98.4 F (36.9 C) (Oral)   Resp 16   LMP 11/18/2016 (Within Days)   SpO2 100%   Physical Exam  Constitutional: She appears well-developed and well-nourished. No distress.  HENT:  Head: Atraumatic.  Eyes: Conjunctivae are normal.  Neck: Neck  supple.  Cardiovascular: Normal rate and regular rhythm.   Pulmonary/Chest: Effort normal and breath sounds normal.  Abdominal: Soft.  Neurological: She is alert.  Skin: No rash noted.  Multiple small superficial scratch marks noted to both hands and forearms and a smalls cratch to L side of neck, does not appear infected.    Psychiatric: She has a normal mood and affect.  Nursing note and vitals reviewed.    ED Treatments / Results  Labs (all labs ordered are listed, but only abnormal results are displayed) Labs Reviewed - No data to display  EKG  EKG Interpretation None       Radiology No results found.  Procedures Procedures (including critical care time)  Medications Ordered in ED Medications  rabies vaccine (RABAVERT) injection 1 mL (not administered)  rabies immune globulin (HYPERAB/KEDRAB) injection 1,800 Units (not administered)     Initial Impression / Assessment and Plan / ED Course  I have reviewed the triage vital signs and the nursing notes.  Pertinent labs & imaging results that were available during my care of the patient were reviewed by me and considered in my medical decision making (see chart for details).     BP (!) 145/85 (BP Location: Right Arm)   Pulse 100   Temp 98.4 F (36.9 C) (Oral)   Resp 16   Ht 5\' 3"  (1.6 m)   Wt 87.1 kg (192 lb)   LMP 11/18/2016 (Within Days)   SpO2 100%   BMI 34.01 kg/m    Final Clinical Impressions(s) / ED Diagnoses   Final diagnoses:  Contact with and suspected exposure to rabies    New Prescriptions New Prescriptions   No medications on file   10:03 AM Pt here with potential exposure to a rabid animal, her kitten. She has multiple scratch marks.  Will give rabies series treatment.  Pt understand to f/u at [facility] for additional series.     Fayrene Helper, PA-C 12/03/16 1015    Benjiman Core, MD 12/03/16 [phone]

## 2018-09-03 IMAGING — DX DG CHEST 2V
2 series · 2 of 2 positions shown · non-contrast
Comparison: None.

CLINICAL DATA: Left arm pain and dizziness with dyspnea 1 hour ago.

EXAM:
CHEST  2 VIEW

[w chest pa]
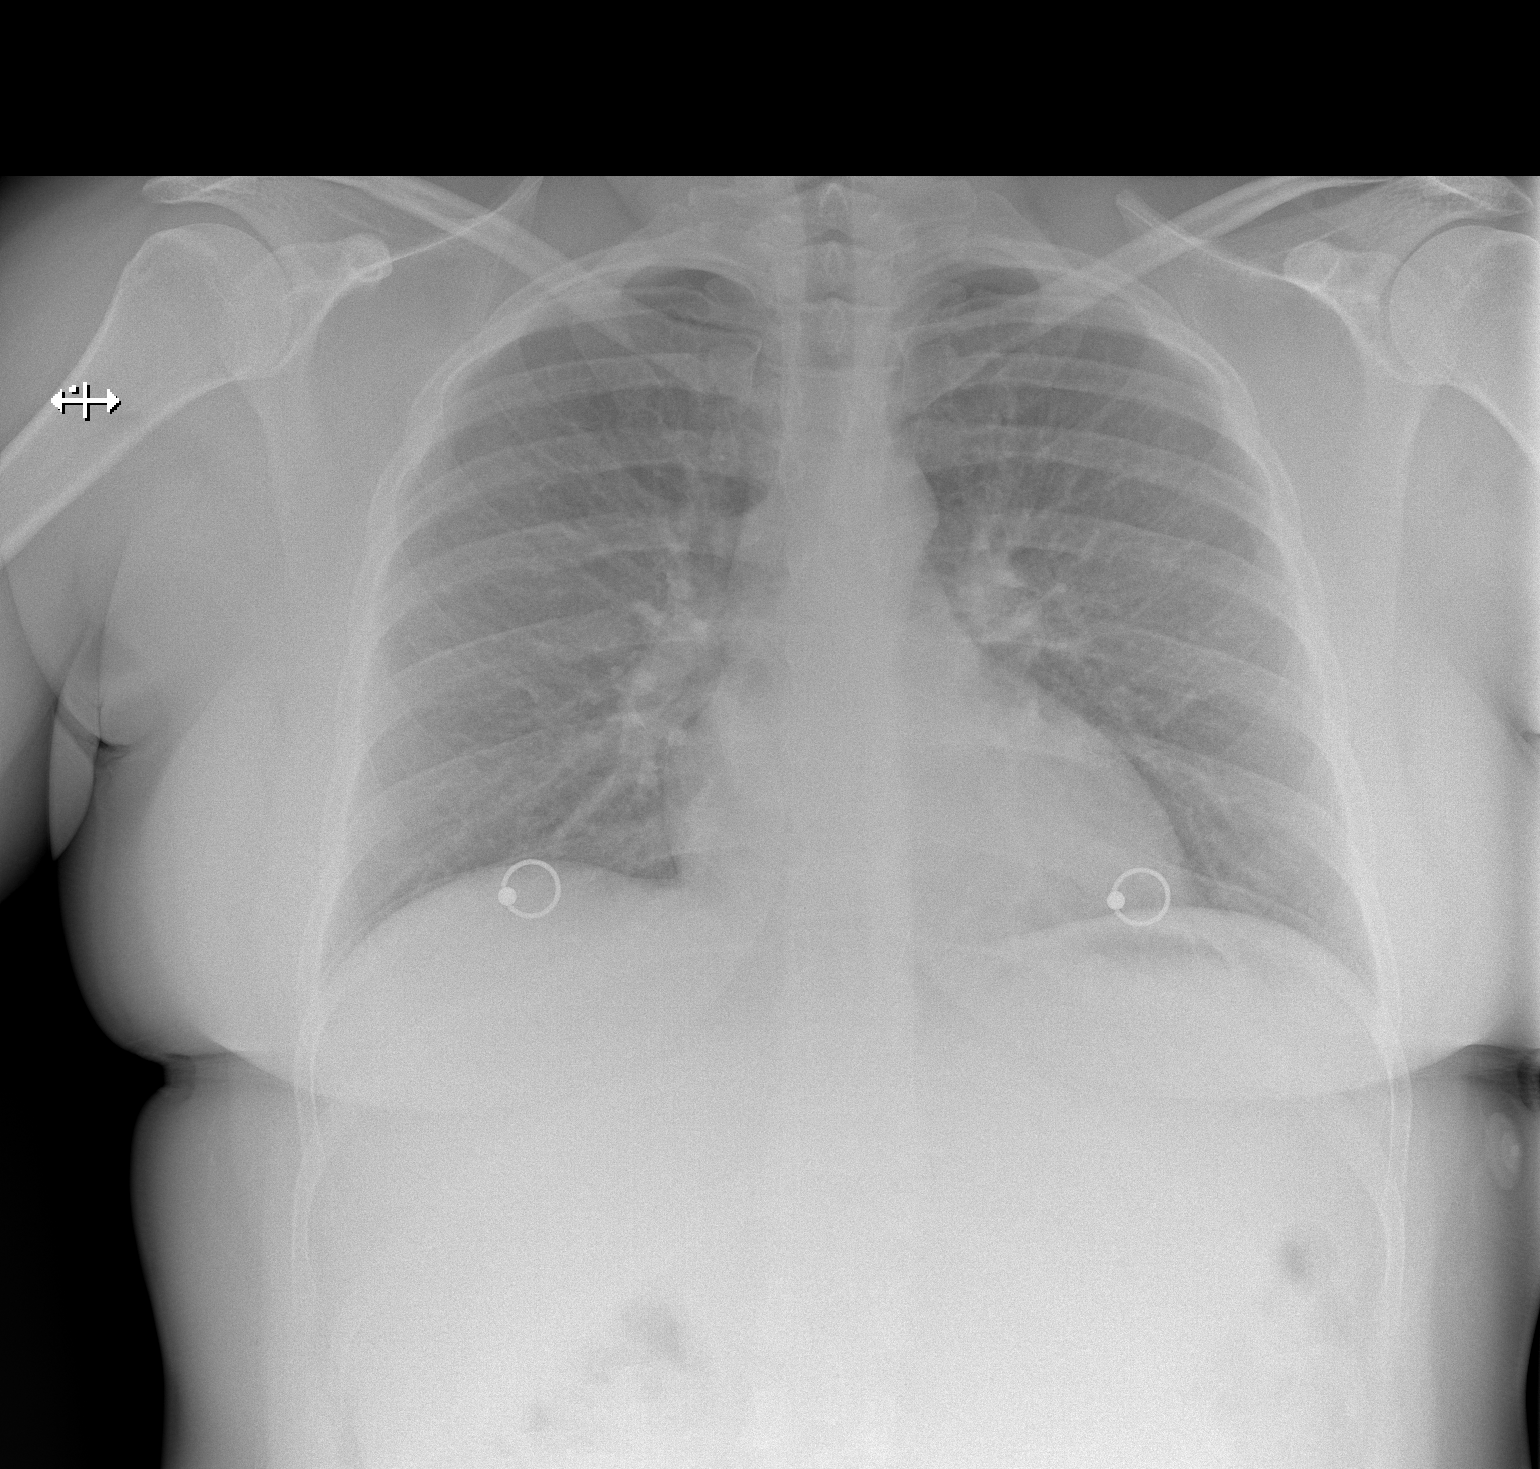

[w chest lat]
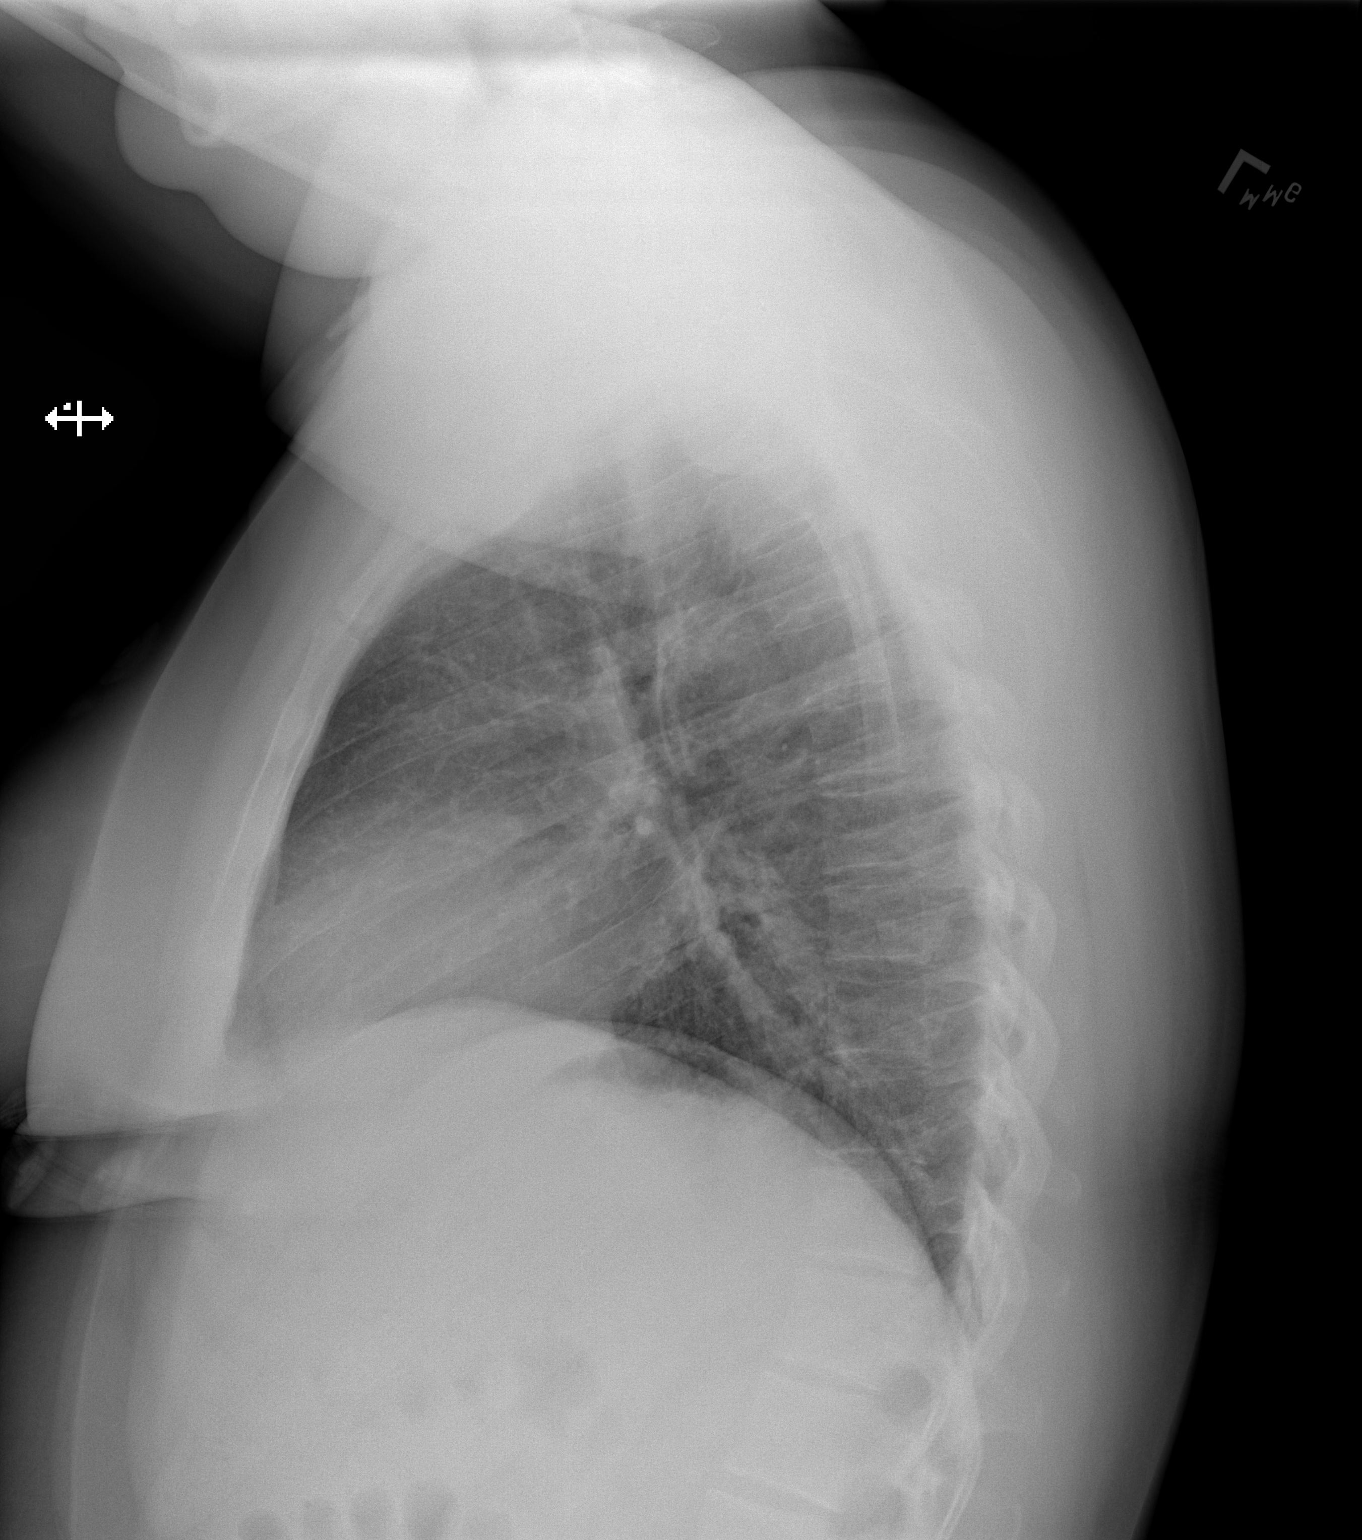

[2 of 2 positions shown; findings below may reference images not displayed]

FINDINGS: The heart size and mediastinal contours are within normal limits.
Both lungs are clear. The visualized skeletal structures are
unremarkable.
IMPRESSION: No active cardiopulmonary disease.
